# Patient Record
Sex: Male | Born: 1975 | Race: White | Hispanic: No | Marital: Married | Smoking: Never smoker
Health system: Southern US, Community
[De-identification: ages and names within clinical notes are randomized; demographics above are authoritative.]

## PROBLEM LIST (undated history)

## (undated) DIAGNOSIS — N200 Calculus of kidney: Secondary | ICD-10-CM

## (undated) DIAGNOSIS — R0602 Shortness of breath: Secondary | ICD-10-CM

## (undated) HISTORY — PX: PALATE / UVULA BIOPSY / EXCISION: SUR128

## (undated) HISTORY — PX: APPENDECTOMY: SHX54

---

## 2019-06-04 ENCOUNTER — Other Ambulatory Visit: Payer: Self-pay

## 2019-06-04 DIAGNOSIS — Z20822 Contact with and (suspected) exposure to covid-19: Secondary | ICD-10-CM

## 2019-06-06 LAB — NOVEL CORONAVIRUS, NAA: SARS-CoV-2, NAA: NOT DETECTED

## 2021-04-09 ENCOUNTER — Other Ambulatory Visit: Payer: Self-pay

## 2021-04-09 ENCOUNTER — Emergency Department (HOSPITAL_BASED_OUTPATIENT_CLINIC_OR_DEPARTMENT_OTHER): Payer: No Typology Code available for payment source

## 2021-04-09 ENCOUNTER — Encounter (HOSPITAL_BASED_OUTPATIENT_CLINIC_OR_DEPARTMENT_OTHER): Payer: Self-pay | Admitting: *Deleted

## 2021-04-09 ENCOUNTER — Emergency Department (HOSPITAL_BASED_OUTPATIENT_CLINIC_OR_DEPARTMENT_OTHER)
Admission: EM | Admit: 2021-04-09 | Discharge: 2021-04-09 | Disposition: A | Discharge status: Home / Self Care | Payer: No Typology Code available for payment source | Attending: Emergency Medicine | Admitting: Emergency Medicine

## 2021-04-09 DIAGNOSIS — N201 Calculus of ureter: Secondary | ICD-10-CM | POA: Insufficient documentation

## 2021-04-09 DIAGNOSIS — R1031 Right lower quadrant pain: Secondary | ICD-10-CM | POA: Diagnosis present

## 2021-04-09 HISTORY — DX: Calculus of kidney: N20.0

## 2021-04-09 LAB — URINALYSIS, ROUTINE W REFLEX MICROSCOPIC
Bilirubin Urine: NEGATIVE
Glucose, UA: NEGATIVE mg/dL
Ketones, ur: NEGATIVE mg/dL
Leukocytes,Ua: NEGATIVE
Nitrite: NEGATIVE
Protein, ur: NEGATIVE mg/dL
Specific Gravity, Urine: 1.02 (ref 1.005–1.030)
pH: 5.5 (ref 5.0–8.0)

## 2021-04-09 LAB — COMPREHENSIVE METABOLIC PANEL
ALT: 39 U/L (ref 0–44)
AST: 27 U/L (ref 15–41)
Albumin: 4.7 g/dL (ref 3.5–5.0)
Alkaline Phosphatase: 80 U/L (ref 38–126)
Anion gap: 9 (ref 5–15)
BUN: 13 mg/dL (ref 6–20)
CO2: 25 mmol/L (ref 22–32)
Calcium: 9.3 mg/dL (ref 8.9–10.3)
Chloride: 102 mmol/L (ref 98–111)
Creatinine, Ser: 1.37 mg/dL — ABNORMAL HIGH (ref 0.61–1.24)
GFR, Estimated: >60 mL/min (ref >60)
Glucose, Bld: 126 mg/dL — ABNORMAL HIGH (ref 70–99)
Potassium: 3.8 mmol/L (ref 3.5–5.1)
Sodium: 136 mmol/L (ref 135–145)
Total Bilirubin: 1 mg/dL (ref 0.3–1.2)
Total Protein: 7.6 g/dL (ref 6.5–8.1)

## 2021-04-09 LAB — CBC WITH DIFFERENTIAL/PLATELET
Abs Immature Granulocytes: 0.04 10*3/uL (ref 0.00–0.07)
Basophils Absolute: 0.1 10*3/uL (ref 0.0–0.1)
Basophils Relative: 1 %
Eosinophils Absolute: 0.2 10*3/uL (ref 0.0–0.5)
Eosinophils Relative: 2 %
HCT: 46.4 % (ref 39.0–52.0)
Hemoglobin: 16 g/dL (ref 13.0–17.0)
Immature Granulocytes: 0 %
Lymphocytes Relative: 26 %
Lymphs Abs: 2.7 10*3/uL (ref 0.7–4.0)
MCH: 29.4 pg (ref 26.0–34.0)
MCHC: 34.5 g/dL (ref 30.0–36.0)
MCV: 85.1 fL (ref 80.0–100.0)
Monocytes Absolute: 0.9 10*3/uL (ref 0.1–1.0)
Monocytes Relative: 9 %
Neutro Abs: 6.2 10*3/uL (ref 1.7–7.7)
Neutrophils Relative %: 62 %
Platelets: 290 10*3/uL (ref 150–400)
RBC: 5.45 MIL/uL (ref 4.22–5.81)
RDW: 13.4 % (ref 11.5–15.5)
WBC: 10.1 10*3/uL (ref 4.0–10.5)
nRBC: 0 % (ref 0.0–0.2)

## 2021-04-09 LAB — URINALYSIS, MICROSCOPIC (REFLEX)

## 2021-04-09 MED ORDER — FENTANYL CITRATE PF 50 MCG/ML IJ SOSY
50.0000 ug | PREFILLED_SYRINGE | Freq: Once | INTRAMUSCULAR | Status: AC
Start: 2021-04-09 — End: 2021-04-09
  Administered 2021-04-09: 50 ug via INTRAVENOUS
  Filled 2021-04-09: qty 1

## 2021-04-09 MED ORDER — ONDANSETRON HCL 4 MG/2ML IJ SOLN
4.0000 mg | Freq: Once | INTRAMUSCULAR | Status: AC
  Administered 2021-04-09: 4 mg via INTRAVENOUS
  Filled 2021-04-09: qty 2

## 2021-04-09 MED ORDER — ONDANSETRON 4 MG PO TBDP: 4.0000 mg | ORAL_TABLET | Freq: Three times a day (TID) | ORAL | 0 refills | Status: DC | PRN

## 2021-04-09 MED ORDER — FENTANYL CITRATE PF 50 MCG/ML IJ SOSY
50.0000 ug | PREFILLED_SYRINGE | INTRAMUSCULAR | Status: DC | PRN
  Administered 2021-04-09: 50 ug via INTRAVENOUS
  Filled 2021-04-09: qty 1

## 2021-04-09 MED ORDER — KETOROLAC TROMETHAMINE 10 MG PO TABS: 10.0000 mg | ORAL_TABLET | Freq: Four times a day (QID) | ORAL | 0 refills | Status: DC | PRN

## 2021-04-09 MED ORDER — OXYCODONE-ACETAMINOPHEN 5-325 MG PO TABS: 1.0000 | ORAL_TABLET | Freq: Four times a day (QID) | ORAL | 0 refills | Status: DC | PRN

## 2021-04-09 NOTE — ED Provider Notes (Signed)
[facility] EMERGENCY DEPARTMENT Provider Note   CSN: [id number] Arrival date & time: 04/09/21  1547     History Chief Complaint  Patient presents with   Flank Pain    Ricardo Ferguson is a 45 y.o. male who presents to the emergency department for further evaluation of waxing and waning sharp right-sided flank pain that began this morning.  He does endorse a history of kidney stones and states this feels similar however is much more severe.  He states his right-sided flank pain radiates into the right lower quadrant abdomen and into the groin.  He reports associated nausea and diaphoresis but denies any vomiting, fever, chills, diarrhea, urinary complaints, chest pain, shortness of breath. He rates his flank pain moderate in severity.   The history is provided by the patient and the spouse. No language interpreter was used.  Flank Pain      Past Medical History:  Diagnosis Date   Kidney stone     There are no problems to display for this patient.   Past Surgical History:  Procedure Laterality Date   APPENDECTOMY         History reviewed. No pertinent family history.  Social History   Tobacco Use   Smoking status: Never   Smokeless tobacco: Never  Substance Use Topics   Alcohol use: Never   Drug use: Never    Home Medications Prior to Admission medications   Medication Sig Start Date End Date Taking? Authorizing Provider  ketorolac (TORADOL) 10 MG tablet Take 1 tablet (10 mg total) by mouth every 6 (six) hours as needed. 04/09/21  Yes Meredeth Ide, Lorina Duffner M, PA-C  ondansetron (ZOFRAN ODT) 4 MG disintegrating tablet Take 1 tablet (4 mg total) by mouth every 8 (eight) hours as needed for nausea or vomiting. 04/09/21  Yes Meredeth Ide, Jermari Tamargo M, PA-C  oxyCODONE-acetaminophen (PERCOCET/ROXICET) 5-325 MG tablet Take 1 tablet by mouth every 6 (six) hours as needed for severe pain. 04/09/21  Yes Honor Loh M, PA-C    Allergies    Patient has no known  allergies.  Review of Systems   Review of Systems  Genitourinary:  Positive for flank pain. Negative for dysuria, frequency, hematuria and urgency.  All other systems reviewed and are negative.  Physical Exam Updated Vital Signs BP (!) 136/102   Pulse 76   Temp 97.8 F (36.6 C) (Oral)   Resp 16   Ht 5\' 8"  (1.727 m)   Wt 99.8 kg   SpO2 98%   BMI 33.45 kg/m   Physical Exam Constitutional:      General: He is not in acute distress.    Appearance: Normal appearance.  HENT:     Head: Normocephalic and atraumatic.  Eyes:     General:        Right eye: No discharge.        Left eye: No discharge.  Cardiovascular:     Comments: Regular rate and rhythm.  S1/S2 are distinct without any evidence of murmur, rubs, or gallops.  Radial pulses are 2+ bilaterally.  Dorsalis pedis pulses are 2+ bilaterally.  No evidence of pedal edema. Pulmonary:     Comments: Clear to auscultation bilaterally.  Normal effort.  No respiratory distress.  No evidence of wheezes, rales, or rhonchi heard throughout. Abdominal:     General: Abdomen is flat. Bowel sounds are normal. There is no distension.     Tenderness: There is no abdominal tenderness. There is no guarding or rebound.  Comments: No CVA tenderness bilaterally.  Musculoskeletal:        General: Normal range of motion.     Cervical back: Neck supple.  Skin:    General: Skin is warm and dry.     Findings: No rash.  Neurological:     General: No focal deficit present.     Mental Status: He is alert.  Psychiatric:        Mood and Affect: Mood normal.        Behavior: Behavior normal.    ED Results / Procedures / Treatments   Labs (all labs ordered are listed, but only abnormal results are displayed) Labs Reviewed  URINALYSIS, ROUTINE W REFLEX MICROSCOPIC - Abnormal; Notable for the following components:      Result Value   Hgb urine dipstick SMALL (*)    All other components within normal limits  COMPREHENSIVE METABOLIC PANEL -  Abnormal; Notable for the following components:   Glucose, Bld 126 (*)    Creatinine, Ser 1.37 (*)    All other components within normal limits  URINALYSIS, MICROSCOPIC (REFLEX) - Abnormal; Notable for the following components:   Bacteria, UA RARE (*)    All other components within normal limits  CBC WITH DIFFERENTIAL/PLATELET    EKG None  Radiology CT Renal Stone Study  Result Date: 04/09/2021 CLINICAL DATA:  Kidney stone suspected.  Flank pain.  Right side. EXAM: CT ABDOMEN AND PELVIS WITHOUT CONTRAST TECHNIQUE: Multidetector CT imaging of the abdomen and pelvis was performed following the standard protocol without IV contrast. COMPARISON:  None. FINDINGS: Lower chest: No acute abnormality.  Question trace hiatal hernia. Hepatobiliary: No focal liver abnormality. No gallstones, gallbladder wall thickening, or pericholecystic fluid. No biliary dilatation. Pancreas: No focal lesion. Normal pancreatic contour. No surrounding inflammatory changes. No main pancreatic ductal dilatation. Spleen: Normal in size without focal abnormality. Adrenals/Urinary Tract: No adrenal nodule bilaterally. Punctate bilateral nephrolithiasis. There is a 2 mm calcified stone within the proximal to mid right ureter with associated mild proximal hydroureteronephrosis. No left hydronephrosis. No definite contour-deforming renal mass. No left ureterolithiasis or hydroureter. The urinary bladder is unremarkable. Stomach/Bowel: Stomach is within normal limits. No evidence of bowel wall thickening or dilatation. Status post appendectomy. Vascular/Lymphatic: No abdominal aorta or iliac aneurysm. Mild atherosclerotic plaque of the aorta and its branches. No abdominal, pelvic, or inguinal lymphadenopathy. Reproductive: Prominent prostate. Other: No intraperitoneal free fluid. No intraperitoneal free gas. No organized fluid collection. Musculoskeletal: No abdominal wall hernia or abnormality. No suspicious lytic or blastic osseous  lesions. No acute displaced fracture. Bilateral L5 pars interarticularis defects. IMPRESSION: 1. Obstructive 2 mm proximal to mid right ureter nephrolithiasis. 2. Nonobstructive bilateral punctate nephrolithiasis. 3. Question trace hiatal hernia. 4. Prominent prostate. Electronically Signed   By: Tish Frederickson M.D.   On: 04/09/2021 17:34    Procedures Procedures   Medications Ordered in ED Medications  fentaNYL (SUBLIMAZE) injection 50 mcg (50 mcg Intravenous Given 04/09/21 1614)  ondansetron (ZOFRAN) injection 4 mg (4 mg Intravenous Given 04/09/21 1614)  fentaNYL (SUBLIMAZE) injection 50 mcg (50 mcg Intravenous Given 04/09/21 1908)    ED Course  I have reviewed the triage vital signs and the nursing notes.  Pertinent labs & imaging results that were available during my care of the patient were reviewed by me and considered in my medical decision making (see chart for details).    MDM Rules/Calculators/A&P  Ricardo Vallee is a 45 y.o. male who presents to the emergency department for right-sided flank pain.  His flank pain is likely secondary to the 2 mm kidney stone that was found on imaging in the mid right ureter.  History and physical exam is not consistent with appendicitis, torsion, hepatobiliary pathology, pyelonephritis at this time.  His CBC and CMP are normal.  UA showed hematuria.  His pain was adequately controlled with fentanyl given in the department.  I told him that he this should pass on its own.  I told him to drink plenty of fluids.  Strict return precautions were given.  Family amenable to this plan. I have given him some percocet and Zofran for at home pain control. He is hemodynamically stable and safe for discharge.  Ketorolac was not prescribed. This was an error and the script was discarded in the shredder.    Final Clinical Impression(s) / ED Diagnoses Final diagnoses:  Ureteral calculi    Rx / DC Orders ED Discharge Orders           Ordered    ketorolac (TORADOL) 10 MG tablet  Every 6 hours PRN        04/09/21 1955    oxyCODONE-acetaminophen (PERCOCET/ROXICET) 5-325 MG tablet  Every 6 hours PRN        04/09/21 1958    ondansetron (ZOFRAN ODT) 4 MG disintegrating tablet  Every 8 hours PRN        04/09/21 1958             Teressa Lower, PA-C 04/09/21 2002    Charlynne Pander, MD 04/09/21 [phone]

## 2021-04-09 NOTE — ED Triage Notes (Signed)
Pt reports right flank pain since this am, severe over last hour, reports hx of kidney stones. Endorses nausea, denies vomiting

## 2021-04-09 NOTE — Discharge Instructions (Addendum)
You were seen and evaluated in the emergency department today for further evaluation of right flank pain.  As we discussed, there is a small kidney stone in the ureter which is causing your pain.  This will likely pass on its own.  Please take Tylenol/ibuprofen on a scheduled basis until you pass the kidney stone her pain resolves. I will also provide you with Toradol for pain.   Please turn to the emerge apartment if you experience worsening pain, unable to pass the stone, fevers, intractable vomiting, or any other concerns might have.

## 2021-09-27 ENCOUNTER — Encounter (HOSPITAL_BASED_OUTPATIENT_CLINIC_OR_DEPARTMENT_OTHER): Payer: Self-pay | Admitting: Emergency Medicine

## 2021-09-27 ENCOUNTER — Other Ambulatory Visit: Payer: Self-pay

## 2021-09-27 ENCOUNTER — Emergency Department (HOSPITAL_BASED_OUTPATIENT_CLINIC_OR_DEPARTMENT_OTHER): Payer: No Typology Code available for payment source

## 2021-09-27 ENCOUNTER — Emergency Department (HOSPITAL_BASED_OUTPATIENT_CLINIC_OR_DEPARTMENT_OTHER)
Admission: EM | Admit: 2021-09-27 | Discharge: 2021-09-27 | Disposition: A | Discharge status: Home / Self Care | Payer: No Typology Code available for payment source | Attending: Emergency Medicine | Admitting: Emergency Medicine

## 2021-09-27 DIAGNOSIS — R0602 Shortness of breath: Secondary | ICD-10-CM | POA: Diagnosis not present

## 2021-09-27 DIAGNOSIS — R0789 Other chest pain: Secondary | ICD-10-CM

## 2021-09-27 HISTORY — DX: Shortness of breath: R06.02

## 2021-09-27 LAB — BASIC METABOLIC PANEL
Anion gap: 6 (ref 5–15)
BUN: 9 mg/dL (ref 6–20)
CO2: 26 mmol/L (ref 22–32)
Calcium: 9 mg/dL (ref 8.9–10.3)
Chloride: 104 mmol/L (ref 98–111)
Creatinine, Ser: 1.08 mg/dL (ref 0.61–1.24)
GFR, Estimated: >60 mL/min (ref >60)
Glucose, Bld: 125 mg/dL — ABNORMAL HIGH (ref 70–99)
Potassium: 3.7 mmol/L (ref 3.5–5.1)
Sodium: 136 mmol/L (ref 135–145)

## 2021-09-27 LAB — CBC WITH DIFFERENTIAL/PLATELET
Abs Immature Granulocytes: 0.03 10*3/uL (ref 0.00–0.07)
Basophils Absolute: 0.1 10*3/uL (ref 0.0–0.1)
Basophils Relative: 1 %
Eosinophils Absolute: 0.3 10*3/uL (ref 0.0–0.5)
Eosinophils Relative: 4 %
HCT: 43.8 % (ref 39.0–52.0)
Hemoglobin: 15.4 g/dL (ref 13.0–17.0)
Immature Granulocytes: 0 %
Lymphocytes Relative: 34 %
Lymphs Abs: 2.7 10*3/uL (ref 0.7–4.0)
MCH: 29.7 pg (ref 26.0–34.0)
MCHC: 35.2 g/dL (ref 30.0–36.0)
MCV: 84.6 fL (ref 80.0–100.0)
Monocytes Absolute: 0.7 10*3/uL (ref 0.1–1.0)
Monocytes Relative: 9 %
Neutro Abs: 4.2 10*3/uL (ref 1.7–7.7)
Neutrophils Relative %: 52 %
Platelets: 233 10*3/uL (ref 150–400)
RBC: 5.18 MIL/uL (ref 4.22–5.81)
RDW: 13.3 % (ref 11.5–15.5)
WBC: 7.9 10*3/uL (ref 4.0–10.5)
nRBC: 0 % (ref 0.0–0.2)

## 2021-09-27 LAB — TROPONIN I (HIGH SENSITIVITY)
Troponin I (High Sensitivity): 3 ng/L (ref <18)
Troponin I (High Sensitivity): 3 ng/L (ref <18)

## 2021-09-27 LAB — D-DIMER, QUANTITATIVE: D-Dimer, Quant: <0.27 ug/mL-FEU (ref 0.00–0.50)

## 2021-09-27 MED ORDER — FUROSEMIDE 10 MG/ML IJ SOLN
40.0000 mg | Freq: Once | INTRAMUSCULAR | Status: AC
  Administered 2021-09-27: 40 mg via INTRAVENOUS
  Filled 2021-09-27: qty 4

## 2021-09-27 MED ORDER — ASPIRIN 81 MG PO CHEW
324.0000 mg | CHEWABLE_TABLET | Freq: Once | ORAL | Status: AC
  Administered 2021-09-27: 324 mg via ORAL
  Filled 2021-09-27: qty 4

## 2021-09-27 MED ORDER — NITROGLYCERIN 0.4 MG SL SUBL
0.4000 mg | SUBLINGUAL_TABLET | SUBLINGUAL | Status: DC | PRN
  Administered 2021-09-27: 0.4 mg via SUBLINGUAL
  Filled 2021-09-27: qty 1

## 2021-09-27 MED ORDER — AMLODIPINE BESYLATE 2.5 MG PO TABS: 2.5000 mg | ORAL_TABLET | Freq: Every day | ORAL | 0 refills | Status: DC

## 2021-09-27 MED ORDER — NITROGLYCERIN 0.4 MG SL SUBL: 0.4000 mg | SUBLINGUAL_TABLET | SUBLINGUAL | 0 refills | Status: AC | PRN

## 2021-09-27 MED ORDER — IOHEXOL 350 MG/ML SOLN
100.0000 mL | Freq: Once | INTRAVENOUS | Status: AC | PRN
  Administered 2021-09-27: 100 mL via INTRAVENOUS

## 2021-09-27 NOTE — ED Provider Notes (Signed)
?[facility] EMERGENCY DEPARTMENT ?Provider Note ? ? ?CSN: [id number] ?Arrival date & time: 09/27/21  0503 ? ?  ? ?History ? ?Chief Complaint  ?Patient presents with  ? Shortness of Breath  ? ? ?Ricardo Ferguson is a 46 y.o. male. ? ?The history is provided by the patient.  ?Shortness of Breath ?He was awakened at about 2:30 AM with difficulty breathing.  He noticed that he felt better if he stood up.  There was an associated heavy, tight feeling in his chest.  There was no nausea, but he has broken out in a sweat.  Nothing makes his symptoms better, nothing made them worse.  The heavy and tight feeling has diminished, but is still present.  There is no radiation of that discomfort.  He has never had symptoms like this before.  However, he did notice that he was more fatigued than normal after playing with his son 2 days ago.  He is a non-smoker and denies history of hypertension or diabetes.  He was told by his primary care provider that his cholesterol was borderline and they wanted to check it 1 more time before deciding whether to put him on medication.  He denies any family history of premature coronary atherosclerosis. ?  ?Home Medications ?Prior to Admission medications   ?Medication Sig Start Date End Date Taking? Authorizing Provider  ?ketorolac (TORADOL) 10 MG tablet Take 1 tablet (10 mg total) by mouth every 6 (six) hours as needed. 04/09/21   Honor Loh M, PA-C  ?ondansetron (ZOFRAN ODT) 4 MG disintegrating tablet Take 1 tablet (4 mg total) by mouth every 8 (eight) hours as needed for nausea or vomiting. 04/09/21   Teressa Lower, PA-C  ?oxyCODONE-acetaminophen (PERCOCET/ROXICET) 5-325 MG tablet Take 1 tablet by mouth every 6 (six) hours as needed for severe pain. 04/09/21   Teressa Lower, PA-C  ?   ? ?Allergies    ?Patient has no known allergies.   ? ?Review of Systems   ?Review of Systems  ?Respiratory:  Positive for shortness of breath.   ?All other systems reviewed and are  negative. ? ?Physical Exam ?Updated Vital Signs ?BP (!) 168/106 (BP Location: Right Arm)   Pulse 79   Temp 97.8 ?F (36.6 ?C) (Oral)   Resp 14   Ht 5\' 7"  (1.702 m)   Wt 107 kg   SpO2 99%   BMI 36.96 kg/m?  ?Physical Exam ?Vitals and nursing note reviewed.  ?46 year old male, resting comfortably and in no acute distress. Vital signs are significant for elevated blood pressure. Oxygen saturation is 99%, which is normal. ?Head is normocephalic and atraumatic. PERRLA, EOMI. Oropharynx is clear. ?Neck is nontender and supple without adenopathy or JVD. ?Back is nontender and there is no CVA tenderness. ?Lungs are clear without rales, wheezes, or rhonchi. ?Chest is nontender. ?Heart has regular rate and rhythm without murmur. ?Abdomen is soft, flat, nontender. ?Extremities have trace edema, full range of motion is present. ?Skin is warm and dry without rash. ?Neurologic: Mental status is normal, cranial nerves are intact, moves all extremities equally. ? ?ED Results / Procedures / Treatments   ?Labs ?(all labs ordered are listed, but only abnormal results are displayed) ?Labs Reviewed  ?BASIC METABOLIC PANEL - Abnormal; Notable for the following components:  ?    Result Value  ? Glucose, Bld 125 (*)   ? All other components within normal limits  ?CBC WITH DIFFERENTIAL/PLATELET  ?D-DIMER, QUANTITATIVE  ?TROPONIN I (HIGH SENSITIVITY)  ?TROPONIN  I (HIGH SENSITIVITY)  ? ? ?EKG ?EKG Interpretation ? ?Date/Time:  Tuesday September 27 2021 05:14:41 EDT ?Ventricular Rate:  79 ?PR Interval:  155 ?QRS Duration: 97 ?QT Interval:  387 ?QTC Calculation: 444 ?R Axis:   6 ?Text Interpretation: Sinus rhythm Normal ECG No old tracing to compare Confirmed by Dione Booze ([id number]) on 09/27/2021 5:20:36 AM ? ?Radiology ?DG Chest Port 1 View ? ?Result Date: 09/27/2021 ?CLINICAL DATA:  Shortness of breath. EXAM: PORTABLE CHEST 1 VIEW COMPARISON:  None. FINDINGS: 0544 hours. Mild asymmetric elevation right hemidiaphragm. The lungs are clear  without focal pneumonia, edema, pneumothorax or pleural effusion. Cardiopericardial silhouette is at upper limits of normal for size. The visualized bony structures of the thorax are unremarkable. Telemetry leads overlie the chest. IMPRESSION: No active disease. Electronically Signed   By: Kennith Center M.D.   On: 09/27/2021 06:21   ? ?Procedures ?Procedures  ?Cardiac monitor shows normal sinus rhythm, per my interpretation. ? ?Medications Ordered in ED ?Medications  ?aspirin chewable tablet 324 mg (has no administration in time range)  ?nitroGLYCERIN (NITROSTAT) SL tablet 0.4 mg (has no administration in time range)  ? ? ?ED Course/ Medical Decision Making/ A&P ?  ?                        ?Medical Decision Making ?Amount and/or Complexity of Data Reviewed ?Labs: ordered. ?Radiology: ordered. ? ?Risk ?OTC drugs. ?Prescription drug management. ? ? ?Dyspnea and chest discomfort concerning for ACS.  Consider pulmonary embolism, pneumonia.  No evidence of bronchospasm to suggest bronchitis or asthma.  Symptoms very atypical for aortic dissection.  Heart score is 2 without including troponin results.  This puts him at low risk for major adverse cardiac events in the next 6 weeks.  Old records are reviewed, and cholesterol and triglycerides have actually been fairly consistently elevated going back through 2018 (results seen on Care Everywhere).  He is given a dose of aspirin and nitroglycerin.  ECG is obtained to look for evidence of ischemic changes, but is normal.  Chest x-ray and troponin have been ordered as well as CBC and metabolic panel. ? ?Chest x-ray shows no acute process.  I have independently viewed the image, and agree with the radiologist's interpretation.  CBC and metabolic panel are unremarkable except for minimally elevated glucose of 125 which will need to be followed as an outpatient.  Initial troponin is 3, repeat troponin is pending.  D-dimer is normal, no evidence of pulmonary embolism.  Patient did  state that he felt better following single nitroglycerin and has not had any recurrence of his chest discomfort.  Case is signed out to Dr. Fredderick Phenix pending repeat troponin. ? ?Final Clinical Impression(s) / ED Diagnoses ?Final diagnoses:  ?SOB (shortness of breath)  ?Chest discomfort  ? ? ?Rx / DC Orders ?ED Discharge Orders   ? ? None  ? ?  ? ? ?  ?Dione Booze, MD ?09/27/21 [phone] ? ?

## 2021-09-27 NOTE — ED Notes (Addendum)
Patient stated that he was not having pain,but that he had pressures in his chest he was given 1 Nitroglycerin 0.4 mg an states that it relieved the pressure.Notified Doctor. Doctor states that the one nitroglycerin was enough. ?

## 2021-09-27 NOTE — Progress Notes (Signed)
Dr. Tenny Craw call and wanted echo ordered for patient. Placed order for patient to have as soon as possible. ?

## 2021-09-27 NOTE — ED Provider Notes (Signed)
Care was taken over from Dr. Roxanne Mins.  Patient presented with an episode of shortness of breath, diaphoresis and mild chest tightness that woke him up during the night.  He does report over the last 2 days he has had 2 episodes of shortness of breath while he is playing with his kids or walking upstairs.  He does have a low heart score.  He has diet-controlled hyperlipidemia and his blood pressure is been elevated in the ED.  No other significant risk factors.  He has had 2 negative troponins.  His EKG does not show any ischemic changes.  I ambulated the patient around the nurses station and he did get a bit short of breath with ambulation.  He did not get hypoxic or tachycardic but did feel short of breath.  Given this, CT scan was performed of his chest which showed no evidence of PE.  I spoke with Dr. Harrington Challenger with cardiology.  She recommended trying a dose of Lasix.  He was given a dose of IV Lasix and diuresed about 1.5 L.  He be ambulated around the ED and felt much better.  He had no symptoms.  No shortness of breath.  I consulted with Dr. Harrington Challenger again and she has arranged close follow-up with Dr. Marlou Porch with cardiology tomorrow at 1 PM.  Patient was advised of this.  He is amenable to this.  He was given a prescription for amlodipine and nitroglycerin per Dr. Alan Ripper request.  I did advise him that if he has any worsening symptoms or symptoms do not resolve after 1 nitroglycerin, he needs to return to the emergency room.  Otherwise follow-up with Dr. Marlou Porch tomorrow. ?  Malvin Johns, MD ?09/27/21 1156 ? ?

## 2021-09-27 NOTE — ED Notes (Addendum)
Pt ambulated after urinating. Pt walked two laps around the nurse station. First lap, pt maintained oxygen saturation between 99-100% on room air. Second lap, patient maintained oxygen saturation between 97%-100% on room air. Denies dyspnea during ambulation and rest. Provider made aware.  ?

## 2021-09-27 NOTE — ED Triage Notes (Signed)
Pt states he woke up around 230 this morning and felt like he could not get a good breath and had pressure in his chest  Pt states he laid back down and tried to relax but the feeling would not go away  Pt states on the way here he started having numbness in his left hand and arm   ?

## 2021-09-28 ENCOUNTER — Encounter: Payer: Self-pay | Admitting: Cardiology

## 2021-09-28 ENCOUNTER — Ambulatory Visit (HOSPITAL_COMMUNITY)
Admission: RE | Admit: 2021-09-28 | Discharge: 2021-09-28 | Disposition: A | Discharge status: Home / Self Care | Payer: No Typology Code available for payment source | Source: Ambulatory Visit | Attending: Internal Medicine | Admitting: Internal Medicine

## 2021-09-28 ENCOUNTER — Ambulatory Visit (INDEPENDENT_AMBULATORY_CARE_PROVIDER_SITE_OTHER): Payer: No Typology Code available for payment source | Admitting: Cardiology

## 2021-09-28 VITALS — BP 122/82 | HR 86 | Ht 67.0 in | Wt 235.0 lb

## 2021-09-28 DIAGNOSIS — G4733 Obstructive sleep apnea (adult) (pediatric): Secondary | ICD-10-CM | POA: Diagnosis not present

## 2021-09-28 DIAGNOSIS — R079 Chest pain, unspecified: Secondary | ICD-10-CM | POA: Diagnosis not present

## 2021-09-28 DIAGNOSIS — R0609 Other forms of dyspnea: Secondary | ICD-10-CM | POA: Diagnosis present

## 2021-09-28 DIAGNOSIS — R0602 Shortness of breath: Secondary | ICD-10-CM | POA: Diagnosis not present

## 2021-09-28 DIAGNOSIS — I517 Cardiomegaly: Secondary | ICD-10-CM | POA: Insufficient documentation

## 2021-09-28 DIAGNOSIS — R072 Precordial pain: Secondary | ICD-10-CM | POA: Insufficient documentation

## 2021-09-28 DIAGNOSIS — I10 Essential (primary) hypertension: Secondary | ICD-10-CM | POA: Diagnosis not present

## 2021-09-28 DIAGNOSIS — R0789 Other chest pain: Secondary | ICD-10-CM

## 2021-09-28 LAB — ECHOCARDIOGRAM COMPLETE
Area-P 1/2: 3.63 cm2
S' Lateral: 2.8 cm

## 2021-09-28 MED ORDER — METOPROLOL TARTRATE 100 MG PO TABS: ORAL_TABLET | ORAL | 0 refills | Status: AC

## 2021-09-28 NOTE — Patient Instructions (Addendum)
Medication Instructions:  ?Your physician recommends that you continue on your current medications as directed. Please refer to the Current Medication list given to you today. ? ?*If you need a refill on your cardiac medications before your next appointment, please call your pharmacy* ? ?Lab Work: ?Your physician recommends that you have lab work today- BMET ? ?If you have labs (blood work) drawn today and your tests are completely normal, you will receive your results only by: ?MyChart Message (if you have MyChart) OR ?A paper copy in the mail ?If you have any lab test that is abnormal or we need to change your treatment, we will call you to review the results. ? ? ?Testing/Procedures: ?Your physician has requested that you have cardiac CT. Cardiac computed tomography (CT) is a painless test that uses an x-ray machine to take clear, detailed pictures of your heart. For further information please visit [email or web address] Please follow instruction sheet as given. ? ?Follow-Up: ?At [facility], you and your health needs are our priority.  As part of our continuing mission to provide you with exceptional heart care, we have created designated Provider Care Teams.  These Care Teams include your primary Cardiologist (physician) and Advanced Practice Providers (APPs -  Physician Assistants and Nurse Practitioners) who all work together to provide you with the care you need, when you need it. ? ?We recommend signing up for the patient portal called "MyChart".  Sign up information is provided on this After Visit Summary.  MyChart is used to connect with patients for Virtual Visits (Telemedicine).  Patients are able to view lab/test results, encounter notes, upcoming appointments, etc.  Non-urgent messages can be sent to your provider as well.   ?To learn more about what you can do with MyChart, go to [email or web address].   ? ?Your next appointment:   ?As needed ? ?The format for your next appointment:   ?In  Person ? ?Provider:   ?Donato Schultz, MD { ? ?Other Instructions ? ? ?Your cardiac CT will be scheduled at one of the below locations:  ? ?[facility] ?[address] ?[place], [place] [address] ?[phone] ? ? ?If scheduled at [facility], please arrive at the [facility] and [facility] Entrance (Entrance C2) of [facility] 30 minutes prior to test start time. ?You can use the FREE valet parking offered at entrance C (encouraged to control the heart rate for the test)  ?Proceed to the [facility] Radiology Department (first floor) to check-in and test prep. ? ?All radiology patients and guests should use entrance C2 at [facility], accessed from [facility], even though the hospital's physical address listed is [address]. ? ? ? ?Please follow these instructions carefully (unless otherwise directed): ? ?Hold all erectile dysfunction medications at least 3 days (72 hrs) prior to test. ? ?On the Night Before the Test: ?Be sure to Drink plenty of water. ?Do not consume any caffeinated/decaffeinated beverages or chocolate 12 hours prior to your test. ?Do not take any antihistamines 12 hours prior to your test. ? ?On the Day of the Test: ?Drink plenty of water until 1 hour prior to the test. ?Do not eat any food 4 hours prior to the test. ?You may take your regular medications prior to the test.  ?Take metoprolol (Lopressor) two hours prior to test. ?     ?After the Test: ?Drink plenty of water. ?After receiving IV contrast, you may experience a mild flushed feeling. This is normal. ?  On occasion, you may experience a mild rash up to 24 hours after the test. This is not dangerous. If this occurs, you can take Benadryl 25 mg and increase your fluid intake. ?If you experience trouble breathing, this can be serious. If it is severe call 911 IMMEDIATELY. If it is mild, please call our office. ? ? ?We will call to schedule your test 2-4 weeks out understanding that some  insurance companies will need an authorization prior to the service being performed.  ? ?For non-scheduling related questions, please contact the cardiac imaging nurse navigator should you have any questions/concerns: ?Rockwell Alexandria, Cardiac Imaging Nurse Navigator ?Larey Brick, Cardiac Imaging Nurse Navigator ?[facility] ?Direct Office Dial: [phone]  ? ?For scheduling needs, including cancellations and rescheduling, please call [place], [phone].  ?

## 2021-09-28 NOTE — Assessment & Plan Note (Signed)
More so a chest pressure with excessive shortness of breath with activity relieved with rest.  No PE on CT.  EKG fairly unremarkable.  Troponin was also normal.  Given these symptoms and recently elevated blood pressure, we will go ahead and proceed with coronary CT scan to ensure that he does not have any evidence of significant coronary stenosis.  Echocardiogram is currently pending upon first glance appears to have normal ejection fraction.   ?

## 2021-09-28 NOTE — Assessment & Plan Note (Signed)
Has had uvuloplasty performed. ?

## 2021-09-28 NOTE — Progress Notes (Signed)
?Cardiology Office Note:   ? ?Date:  09/28/2021  ? ?ID:  Ricardo Franson, DOB June 04, 1976, MRN [id number] ? ?PCP:  Pcp, No  ?Cardiologist:  Donato Schultz, MD   ? ?Referring MD: No ref. provider found  ? ? ? ?History of Present Illness:   ? ?Ricardo Ferguson is a 46 y.o. male here today for hospital follow-up. ? ?He presented to the ED 09/27/21 for shortness of breath, diaphoresis, and chest tightness that woke him up. Over the previous 2 days, he had 2 episodes of SOB with exertion. Troponins were negative and EKG sis not chow ischemic changes. Chest CT showed no evidence of PE. His Heart Score was 2. He was given amlodipine and nitroglycerine and advised to follow-up with cardiology.  ? ?Today, he is accompanied by his wife. A few days ago, he was playing with his children and became winded and had to stop and rest. Tuesday morning, he woke up at 2:30 AM with shortness of breath. He describes the sensation as breathing in but not being able to breath out. He also had chest pressure which he does not describe as pain. At 5:00 AM, he started driving to the ED and noticed tingling in his R arm. His headache starting yesterday continues to persist. He denies any palpitations, lightheadedness, syncope, PND, or lower extremity edema. ? ?He does not smoke. He denies a history of cardiovascular disease on his mother's side. His father's side medical history is unknown. He reports he underwent a sleep test and uvulopalatopharyngoplasty in the past.  ? ?Consider further sleep evaluation once again.  Also he is going to keep on top of his cholesterol.  He is having this repeated soon he states. ? ?ER notes labs CT scan personally reviewed and interpreted ? ?Past Medical History:  ?Diagnosis Date  ? Kidney stone   ? SOB (shortness of breath)   ? ? ?Past Surgical History:  ?Procedure Laterality Date  ? APPENDECTOMY    ? PALATE / UVULA BIOPSY / EXCISION    ? ? ?Current Medications: ?Current Meds  ?Medication Sig  ? amLODipine (NORVASC)  2.5 MG tablet Take 1 tablet (2.5 mg total) by mouth daily.  ? metoprolol tartrate (LOPRESSOR) 100 MG tablet Take one tablet by mouth 2 hours prior to CT scan  ? nitroGLYCERIN (NITROSTAT) 0.4 MG SL tablet Place 1 tablet (0.4 mg total) under the tongue every 5 (five) minutes as needed for chest pain.  ?  ? ?Allergies:   Patient has no known allergies.  ? ?Social History  ? ?Socioeconomic History  ? Marital status: Married  ?  Spouse name: Not on file  ? Number of children: Not on file  ? Years of education: Not on file  ? Highest education level: Not on file  ?Occupational History  ? Not on file  ?Tobacco Use  ? Smoking status: Never  ? Smokeless tobacco: Never  ?Vaping Use  ? Vaping Use: Never used  ?Substance and Sexual Activity  ? Alcohol use: Never  ? Drug use: Never  ? Sexual activity: Not on file  ?Other Topics Concern  ? Not on file  ?Social History Narrative  ? Not on file  ? ?Social Determinants of Health  ? ?Financial Resource Strain: Not on file  ?Food Insecurity: Not on file  ?Transportation Needs: Not on file  ?Physical Activity: Not on file  ?Stress: Not on file  ?Social Connections: Not on file  ?  ? ?Family History: ?The patient's family history is not  on file. ? ?ROS:   ?Please see the history of present illness.    ?(+) Chest pain/pressure ?(+) Shortness of breath ?(+) Tingling (R arm) ?(+) Headache ?All other systems reviewed and negative.  ? ?EKGs/Labs/Other Studies Reviewed:   ? ?The following studies were reviewed today: ?Echo 09/28/21 results not yet available. ? ?CTA Chest 09/27/21 ?IMPRESSION: ?1. No CT evidence for acute pulmonary embolus. ?2. 3 mm left lower lobe pulmonary nodule. No follow-up needed if patient is low-risk.This recommendation follows the consensus statement: Guidelines for Management of Incidental Pulmonary Nodules Detected on CT Images: From the Fleischner Society 2017; Radiology 2017; 284:228-243. ?3. Hepatic steatosis. ? ?Chest X-Ray 09/27/21 ?FINDINGS: ?0544 hours. Mild  asymmetric elevation right hemidiaphragm. The lungs are clear without focal pneumonia, edema, pneumothorax or pleural effusion. Cardiopericardial silhouette is at upper limits of normal for size. The visualized bony structures of the thorax are unremarkable. Telemetry leads overlie the chest. ?  ?IMPRESSION: ?No active disease. ? ?EKG:  EKG was not ordered today ? ?Recent Labs: ?04/09/2021: ALT 39 ?09/27/2021: BUN 9; Creatinine, Ser 1.08; Hemoglobin 15.4; Platelets 233; Potassium 3.7; Sodium 136  ? ?Recent Lipid Panel ?No results found for: CHOL, TRIG, HDL, CHOLHDL, VLDL, LDLCALC, LDLDIRECT ? ?CHA2DS2-VASc Score =   [ ] .  Therefore, the patient's annual risk of stroke is   %.    ?  ? ? ?Physical Exam:   ? ?VS:  BP 122/82   Pulse 86   Ht 5\' 7"  (1.702 m)   Wt 235 lb (106.6 kg)   SpO2 96%   BMI 36.81 kg/m?    ? ?Wt Readings from Last 3 Encounters:  ?09/28/21 235 lb (106.6 kg)  ?09/27/21 236 lb (107 kg)  ?04/09/21 220 lb (99.8 kg)  ?  ? ?GEN: Well nourished, well developed in no acute distress ?HEENT: Normal ?NECK: No JVD; No carotid bruits ?LYMPHATICS: No lymphadenopathy ?CARDIAC: RRR, no murmurs, rubs, gallops ?RESPIRATORY:  Clear to auscultation without rales, wheezing or rhonchi  ?ABDOMEN: Soft, non-tender, non-distended ?MUSCULOSKELETAL:  No edema; No deformity  ?SKIN: Warm and dry ?NEUROLOGIC:  Alert and oriented x 3 ?PSYCHIATRIC:  Normal affect  ? ?ASSESSMENT:   ? ?1. SOB (shortness of breath)   ?2. Precordial chest pain   ?3. Essential hypertension   ?4. Obstructive sleep apnea   ? ?PLAN:   ? ?Precordial chest pain ?More so a chest pressure with excessive shortness of breath with activity relieved with rest.  No PE on CT.  EKG fairly unremarkable.  Troponin was also normal.  Given these symptoms and recently elevated blood pressure, we will go ahead and proceed with coronary CT scan to ensure that he does not have any evidence of significant coronary stenosis.  Echocardiogram is currently pending upon first  glance appears to have normal ejection fraction.   ? ?Essential hypertension ?Currently controlled with amlodipine 2.5 mg.  Perhaps an isolated markedly elevated blood pressure was causing shortness of breath? ? ?Obstructive sleep apnea ?Has had uvuloplasty performed. ? ? ?  ? ?Medication Adjustments/Labs and Tests Ordered: ?Current medicines are reviewed at length with the patient today.  Concerns regarding medicines are outlined above.  ?Orders Placed This Encounter  ?Procedures  ? CT CORONARY MORPH W/CTA COR W/SCORE W/CA W/CM &/OR WO/CM  ? ?Meds ordered this encounter  ?Medications  ? metoprolol tartrate (LOPRESSOR) 100 MG tablet  ?  Sig: Take one tablet by mouth 2 hours prior to CT scan  ?  Dispense:  1 tablet  ?  Refill:  0  ? ?I,Mykaella Javier,acting as a scribe for Coca ColaMark Abuk Selleck, MD.,have documented all relevant documentation on the behalf of Donato SchultzMark Ching Rabideau, MD,as directed by  Donato SchultzMark Laelani Vasko, MD while in the presence of Donato SchultzMark Rivers Gassmann, MD. ? ?I, Donato SchultzMark Robson Trickey, MD, have reviewed all documentation for this visit. The documentation on 09/28/21 for the exam, diagnosis, procedures, and orders are all accurate and complete.  ? ?Signed, ?Donato SchultzMark Mackayla Mullins, MD  ?09/28/2021 2:00 PM    ?[facility] ? ?

## 2021-09-28 NOTE — Assessment & Plan Note (Signed)
Currently controlled with amlodipine 2.5 mg.  Perhaps an isolated markedly elevated blood pressure was causing shortness of breath? ?

## 2021-10-04 ENCOUNTER — Encounter: Payer: Self-pay | Admitting: Cardiology

## 2021-10-05 MED ORDER — AMLODIPINE BESYLATE 2.5 MG PO TABS: 2.5000 mg | ORAL_TABLET | Freq: Every day | ORAL | 3 refills | Status: DC

## 2021-10-07 ENCOUNTER — Telehealth (HOSPITAL_COMMUNITY): Payer: Self-pay | Admitting: Emergency Medicine

## 2021-10-07 NOTE — Telephone Encounter (Signed)
Reaching out to patient to offer assistance regarding upcoming cardiac imaging study; pt verbalizes understanding of appt date/time, parking situation and where to check in, pre-test NPO status and medications ordered, and verified current allergies; name and call back number provided for further questions should they arise ?Rockwell Alexandria RN Navigator Cardiac Imaging ?[facility] ?[phone] office ?[phone] cell ? ?Denies Iv issues ?100mg  metoprolol  ?Arrival 730 ? ?

## 2021-10-11 ENCOUNTER — Ambulatory Visit (HOSPITAL_COMMUNITY)
Admission: RE | Admit: 2021-10-11 | Discharge: 2021-10-11 | Disposition: A | Discharge status: Home / Self Care | Payer: No Typology Code available for payment source | Source: Ambulatory Visit | Attending: Cardiology | Admitting: Cardiology

## 2021-10-11 DIAGNOSIS — I2510 Atherosclerotic heart disease of native coronary artery without angina pectoris: Secondary | ICD-10-CM

## 2021-10-11 DIAGNOSIS — R0602 Shortness of breath: Secondary | ICD-10-CM | POA: Diagnosis present

## 2021-10-11 MED ORDER — NITROGLYCERIN 0.4 MG SL SUBL
0.8000 mg | SUBLINGUAL_TABLET | Freq: Once | SUBLINGUAL | Status: AC
  Administered 2021-10-11: 0.8 mg via SUBLINGUAL

## 2021-10-11 MED ORDER — IOHEXOL 350 MG/ML SOLN
100.0000 mL | Freq: Once | INTRAVENOUS | Status: AC | PRN
  Administered 2021-10-11: 100 mL via INTRAVENOUS

## 2021-10-11 MED ORDER — NITROGLYCERIN 0.4 MG SL SUBL
SUBLINGUAL_TABLET | SUBLINGUAL | Status: AC
  Filled 2021-10-11: qty 2

## 2021-10-12 ENCOUNTER — Other Ambulatory Visit: Payer: Self-pay | Admitting: *Deleted

## 2021-10-12 DIAGNOSIS — R0789 Other chest pain: Secondary | ICD-10-CM

## 2021-10-12 DIAGNOSIS — R931 Abnormal findings on diagnostic imaging of heart and coronary circulation: Secondary | ICD-10-CM

## 2021-10-12 MED ORDER — ROSUVASTATIN CALCIUM 10 MG PO TABS: 10.0000 mg | ORAL_TABLET | Freq: Every day | ORAL | 3 refills | Status: DC

## 2022-10-07 IMAGING — CT CT RENAL STONE PROTOCOL
2 of 4 series · 16 of 46 positions shown, 18 images · non-contrast
Comparison: None.

CLINICAL DATA: Kidney stone suspected.  Flank pain.  Right side.

EXAM:
CT ABDOMEN AND PELVIS WITHOUT CONTRAST
TECHNIQUE: Multidetector CT imaging of the abdomen and pelvis was performed
following the standard protocol without IV contrast.

[Series 2: axial st · axial · 0.85mm/px · z∈[+833,+1283]mm · 13 of 100 slices shown, 15 images]
[im 5/100  soft-tissue]
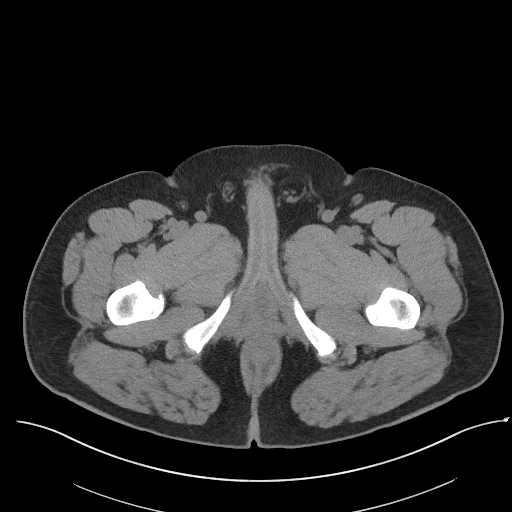
[im 5/100  bone]
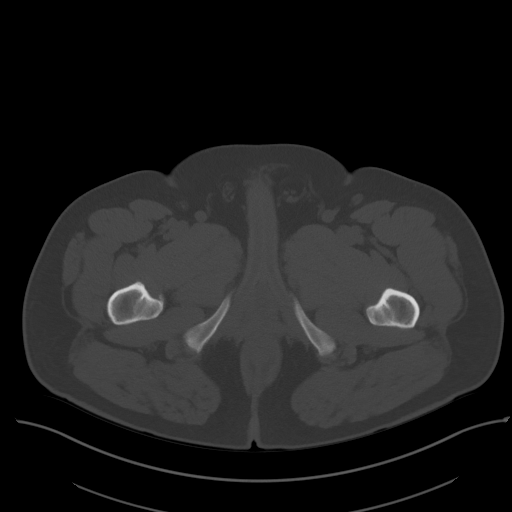
[im 13/100  soft-tissue]
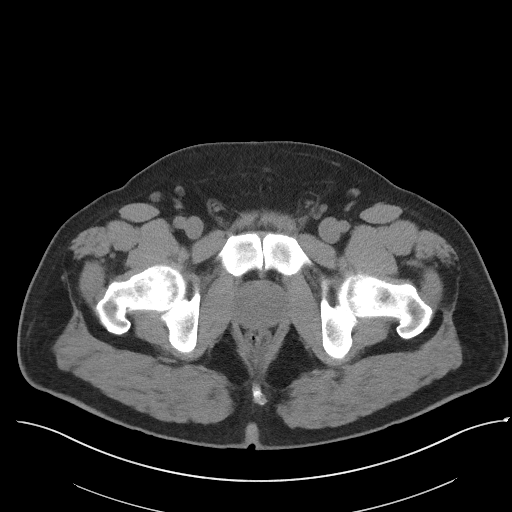
[im 21/100  soft-tissue]
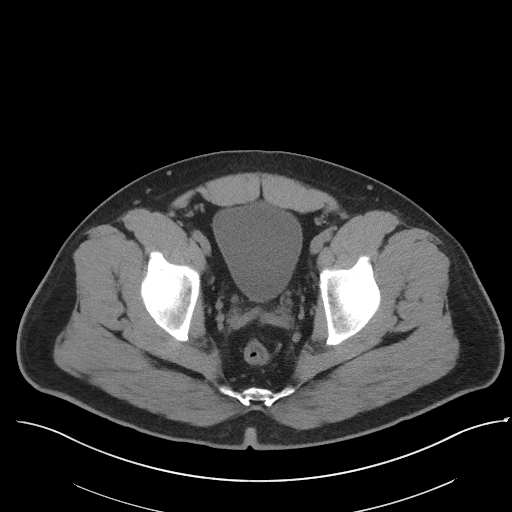
[im 29/100  soft-tissue]
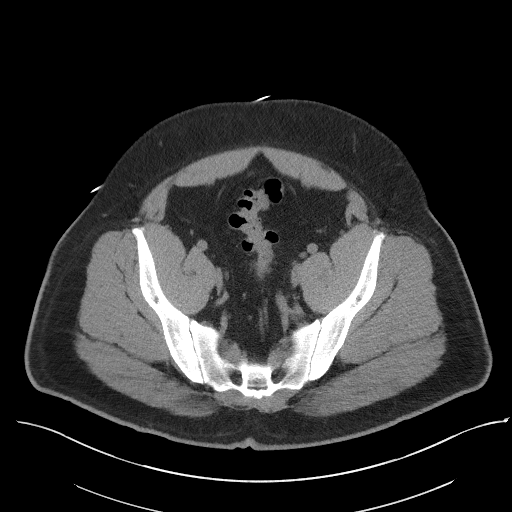
[im 34/100  soft-tissue]
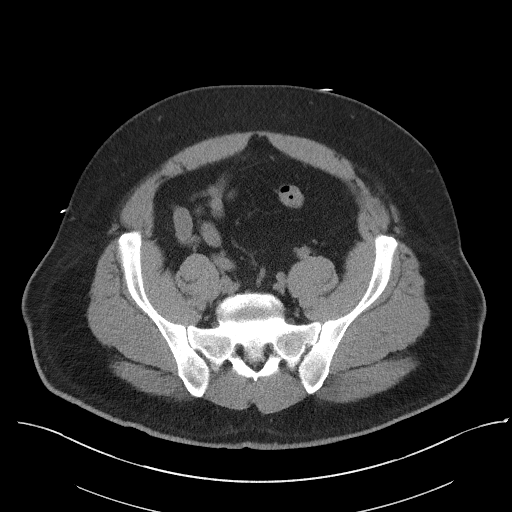
[im 42/100  soft-tissue]
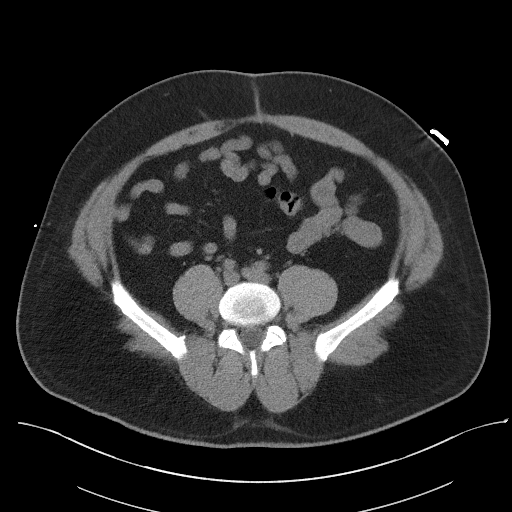
[im 50/100  soft-tissue]
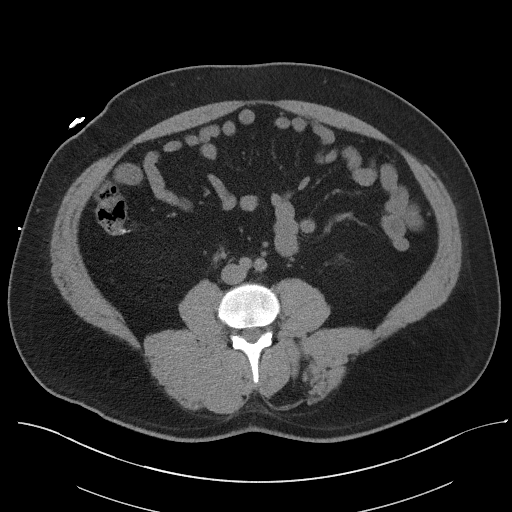
[im 58/100  soft-tissue]
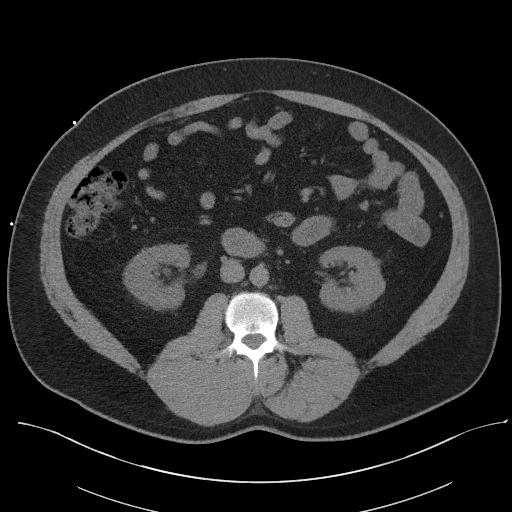
[im 67/100  soft-tissue]
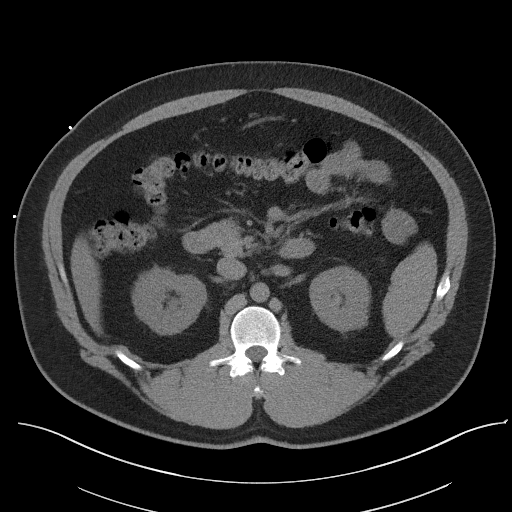
[im 67/100  bone]
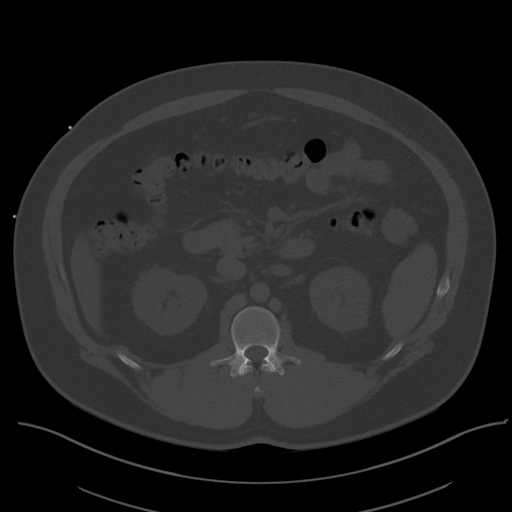
[im 71/100  soft-tissue]
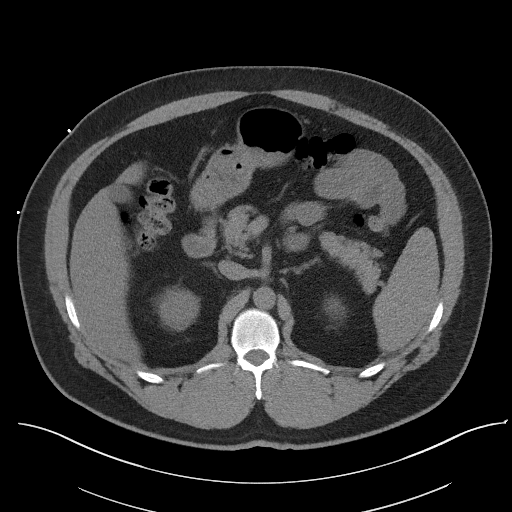
[im 79/100  soft-tissue]
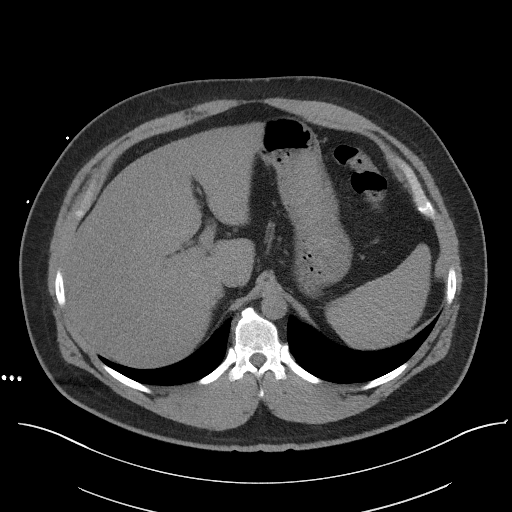
[im 87/100  soft-tissue]
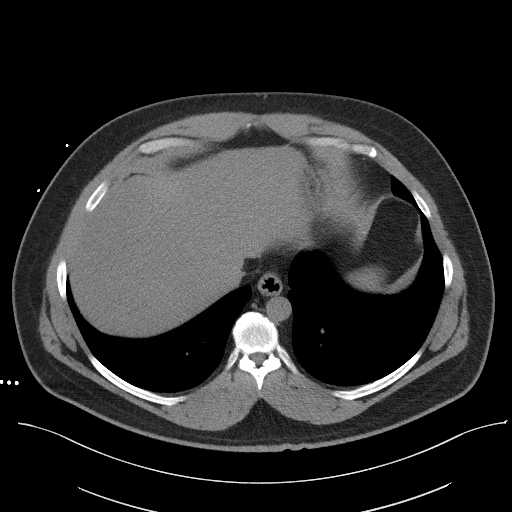
[im 95/100  soft-tissue]
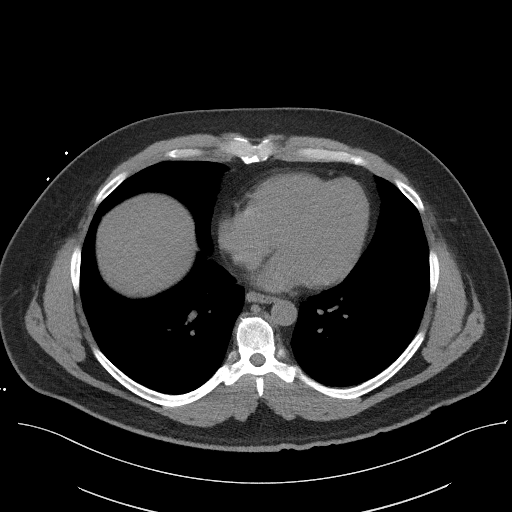

[Series 4: coronal st · coronal · 0.91mm/px · 3 of 106 slices shown]
[im 36/106  soft-tissue]
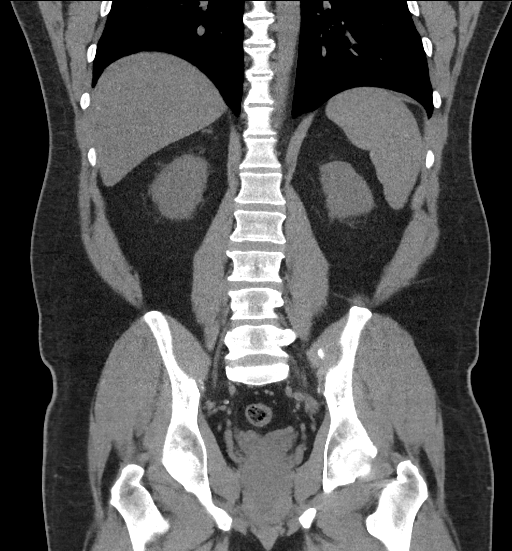
[im 47/106  soft-tissue]
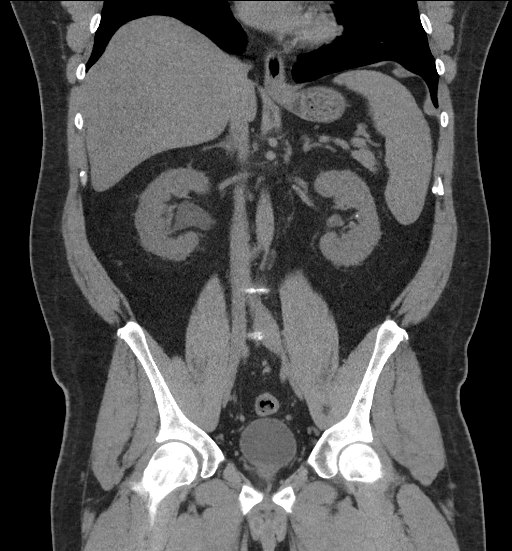
[im 59/106  soft-tissue]
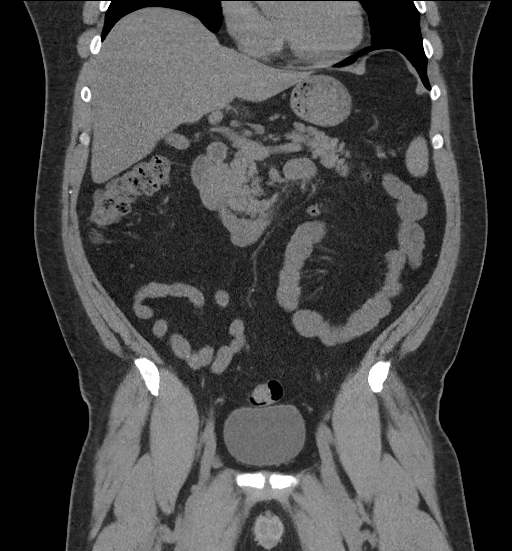

[16 of 46 positions shown; findings below may reference images not displayed]

FINDINGS: Lower chest: No acute abnormality.  Question trace hiatal hernia.

Hepatobiliary: No focal liver abnormality. No gallstones,
gallbladder wall thickening, or pericholecystic fluid. No biliary
dilatation.

Pancreas: No focal lesion. Normal pancreatic contour. No surrounding
inflammatory changes. No main pancreatic ductal dilatation.

Spleen: Normal in size without focal abnormality.

Adrenals/Urinary Tract:

No adrenal nodule bilaterally.

Punctate bilateral nephrolithiasis. There is a 2 mm calcified stone
within the proximal to mid right ureter with associated mild
proximal hydroureteronephrosis. No left hydronephrosis. No definite
contour-deforming renal mass.

No left ureterolithiasis or hydroureter.

The urinary bladder is unremarkable.

Stomach/Bowel: Stomach is within normal limits. No evidence of bowel
wall thickening or dilatation. Status post appendectomy.

Vascular/Lymphatic: No abdominal aorta or iliac aneurysm. Mild
atherosclerotic plaque of the aorta and its branches. No abdominal,
pelvic, or inguinal lymphadenopathy.

Reproductive: Prominent prostate.

Other: No intraperitoneal free fluid. No intraperitoneal free gas.
No organized fluid collection.

Musculoskeletal:

No abdominal wall hernia or abnormality.

No suspicious lytic or blastic osseous lesions. No acute displaced
fracture. Bilateral L5 pars interarticularis defects.
IMPRESSION: 1. Obstructive 2 mm proximal to mid right ureter nephrolithiasis.
2. Nonobstructive bilateral punctate nephrolithiasis.
3. Question trace hiatal hernia.
4. Prominent prostate.

## 2022-10-09 ENCOUNTER — Other Ambulatory Visit: Payer: Self-pay | Admitting: Cardiology

## 2022-11-06 ENCOUNTER — Other Ambulatory Visit: Payer: Self-pay | Admitting: Cardiology

## 2022-11-10 ENCOUNTER — Other Ambulatory Visit: Payer: Self-pay | Admitting: Cardiology

## 2022-11-17 ENCOUNTER — Other Ambulatory Visit: Payer: Self-pay | Admitting: Cardiology

## 2022-11-24 ENCOUNTER — Other Ambulatory Visit: Payer: Self-pay | Admitting: Cardiology

## 2022-12-23 ENCOUNTER — Other Ambulatory Visit: Payer: Self-pay | Admitting: Cardiology

## 2023-02-15 ENCOUNTER — Encounter: Payer: Self-pay | Admitting: Cardiology

## 2023-02-15 ENCOUNTER — Other Ambulatory Visit: Payer: Self-pay | Admitting: Cardiology

## 2023-03-27 IMAGING — DX DG CHEST 1V PORT
1 series · 1 of 1 positions shown · non-contrast
Comparison: None.

CLINICAL DATA: Shortness of breath.

EXAM:
PORTABLE CHEST 1 VIEW

[chest ap]
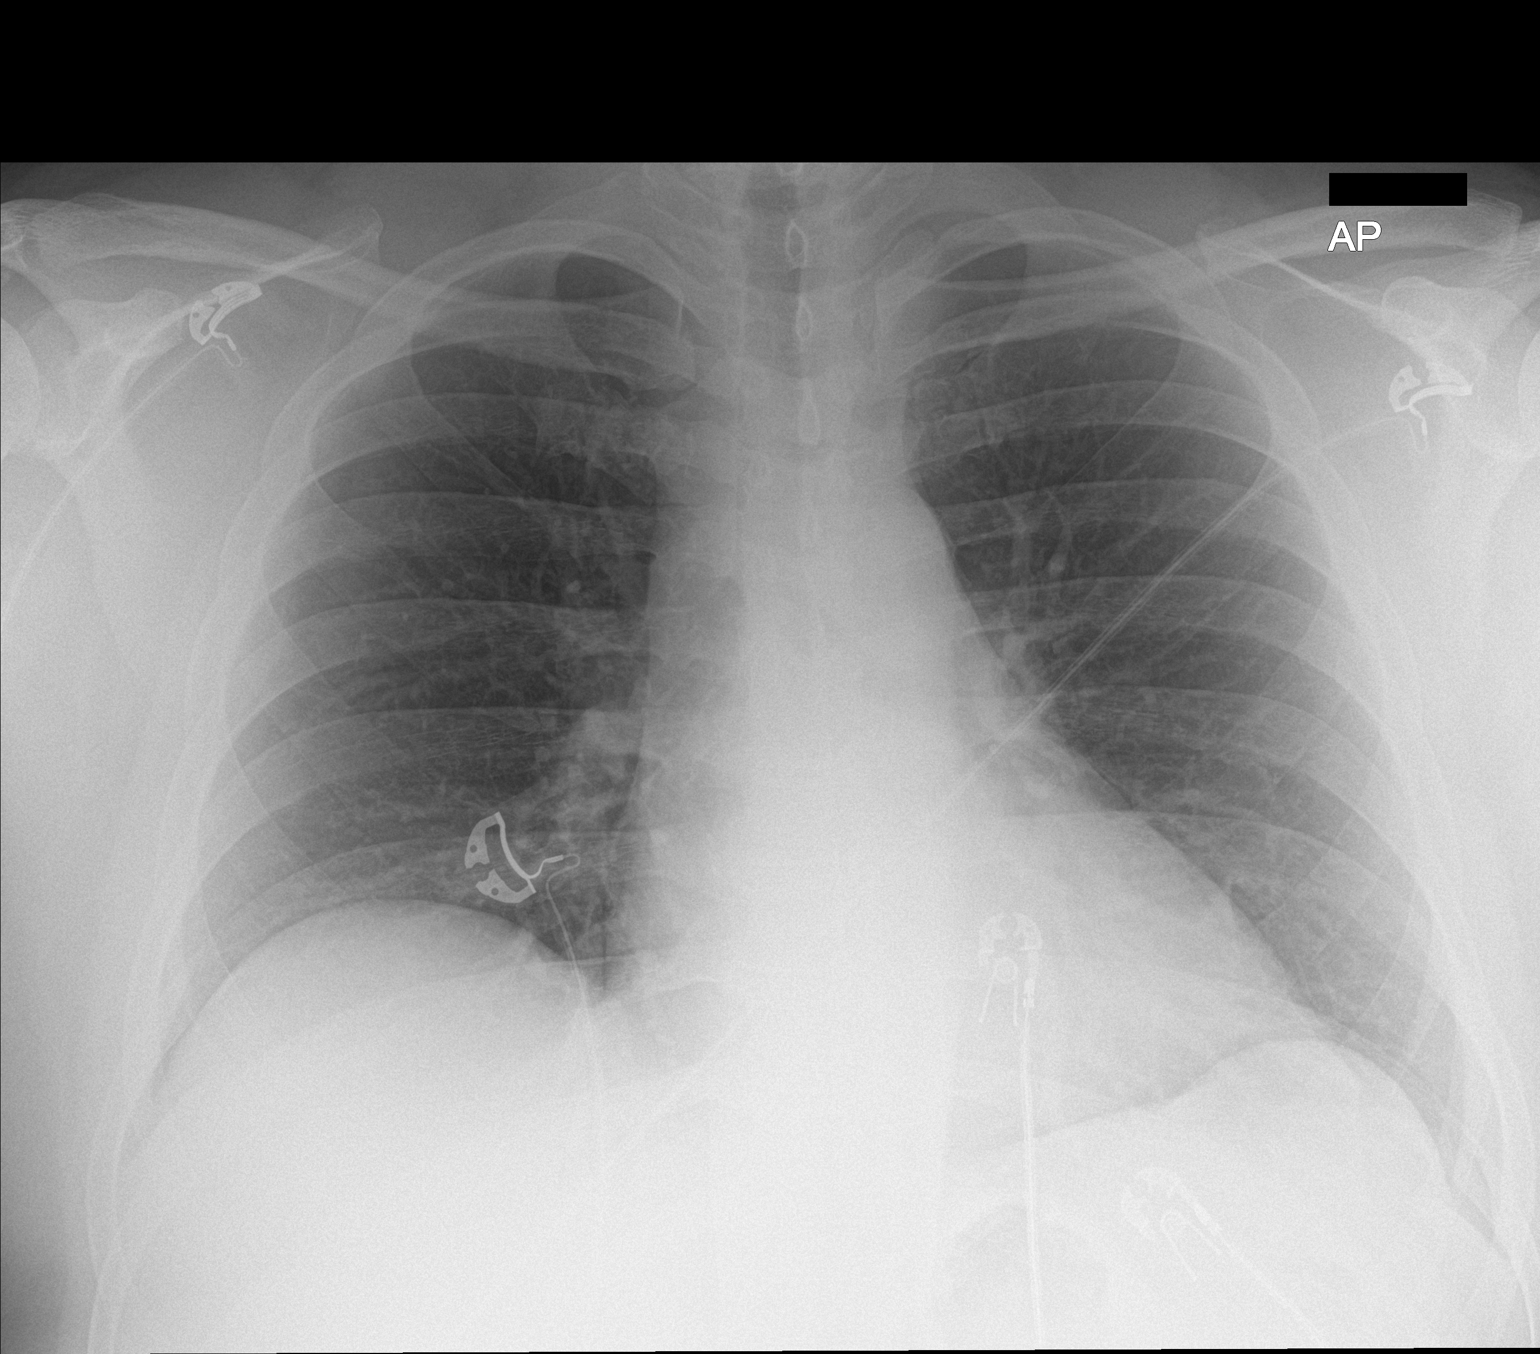

[1 of 1 positions shown; findings below may reference images not displayed]

FINDINGS: 5100 hours. Mild asymmetric elevation right hemidiaphragm. The lungs
are clear without focal pneumonia, edema, pneumothorax or pleural
effusion. Cardiopericardial silhouette is at upper limits of normal
for size. The visualized bony structures of the thorax are
unremarkable. Telemetry leads overlie the chest.
IMPRESSION: No active disease.

## 2023-04-10 IMAGING — CT CT HEART MORP W/ CTA COR W/ SCORE W/ CA W/CM &/OR W/O CM
4 of 7 series · 8 of 20 positions shown, 9 images · IV contrast (APPLIED)
Comparison: None.
COMPARISON: None.

Addendum:
EXAM:
OVER-READ INTERPRETATION  CT CHEST

The following report is an over-read performed by radiologist Dr.
Blade Aujla [REDACTED] on 10/11/2021. This
over-read does not include interpretation of cardiac or coronary
anatomy or pathology. The coronary calcium score/coronary CTA
interpretation by the cardiologist is attached.
CLINICAL DATA: 46 Year old White Male
Cardiac/Coronary  CTA
TECHNIQUE: The patient was scanned on a [employer] scanner.

[Series 6: ts diast sharp · axial · 0.39mm/px · z∈[-138,-101]mm · 2 of 278 slices shown]
[im 93/278  lung]
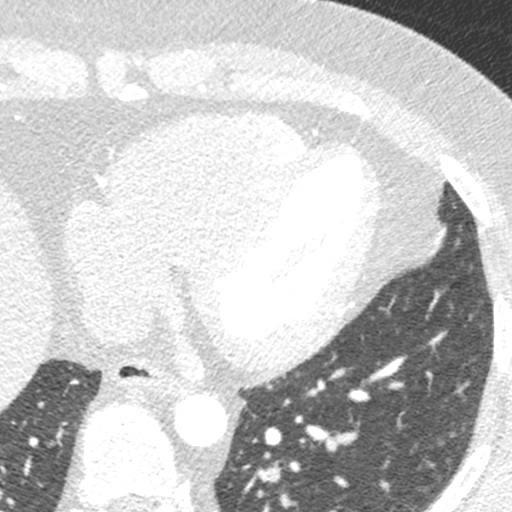
[im 185/278  lung]
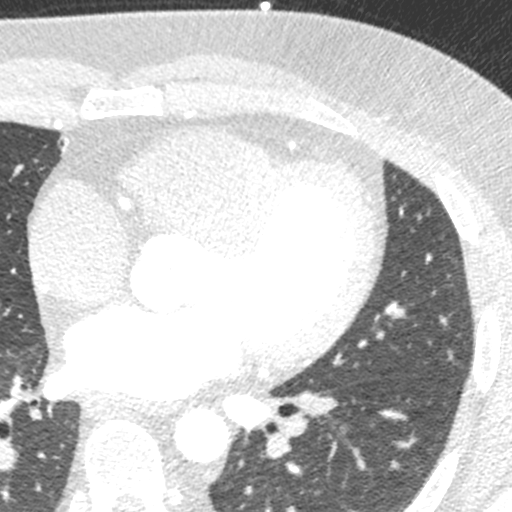

[Series 7: ts syst sharp · axial · 0.39mm/px · z∈[-138,-101]mm · 2 of 278 slices shown]
[im 93/278  lung]
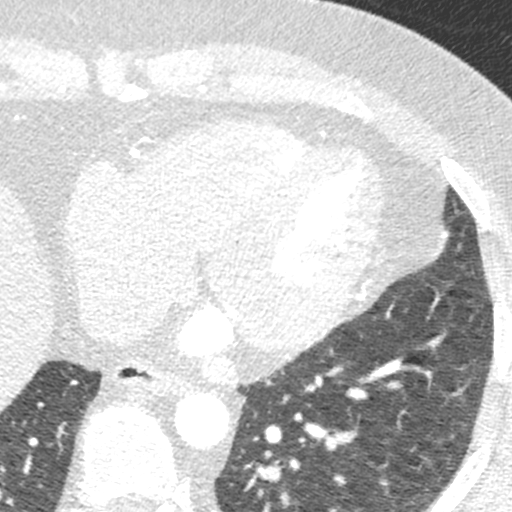
[im 185/278  lung]
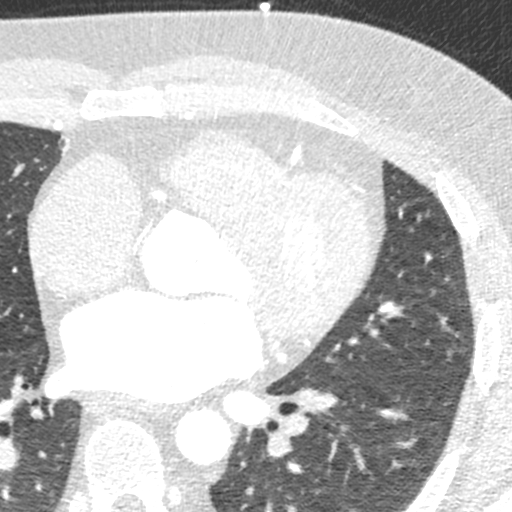

[Series 8: best syst · axial · 0.39mm/px · z∈[-138,-101]mm · 2 of 278 slices shown, 3 images]
[im 93/278  vessel]
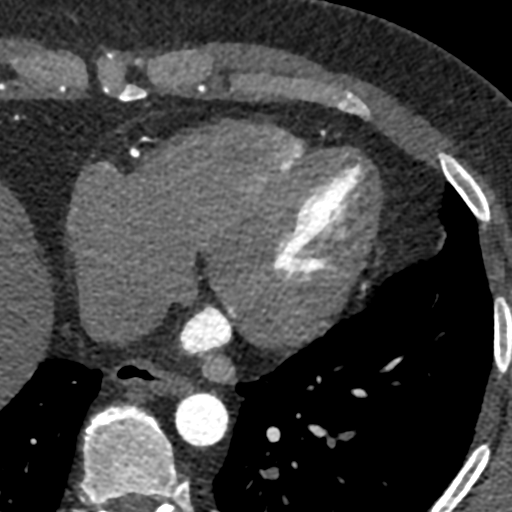
[im 93/278  lung]
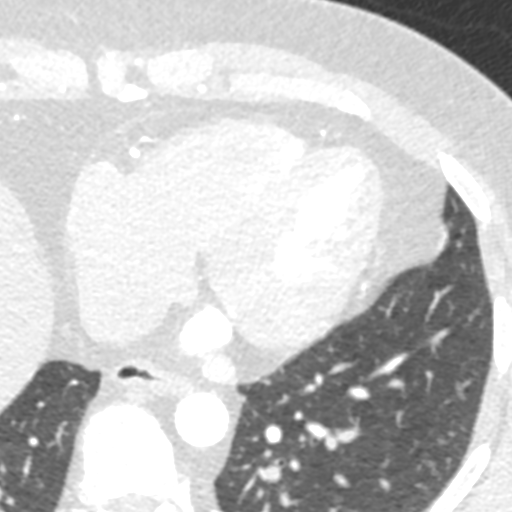
[im 185/278  vessel]
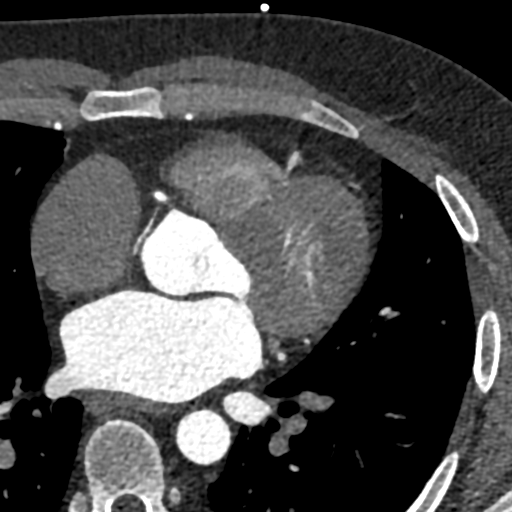

[Series 9: best diast · axial · 0.39mm/px · z∈[-138,-101]mm · 2 of 278 slices shown]
[im 93/278  vessel]
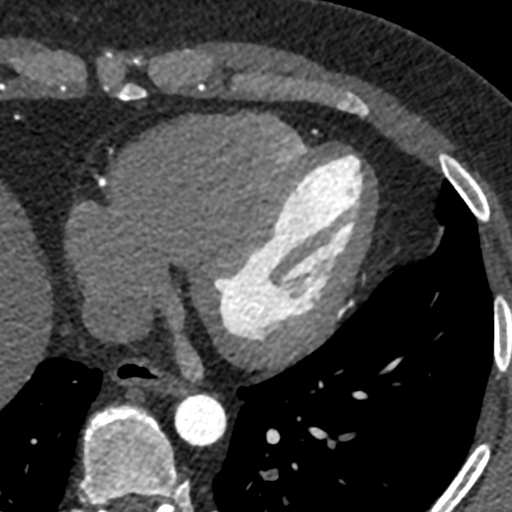
[im 185/278  vessel]
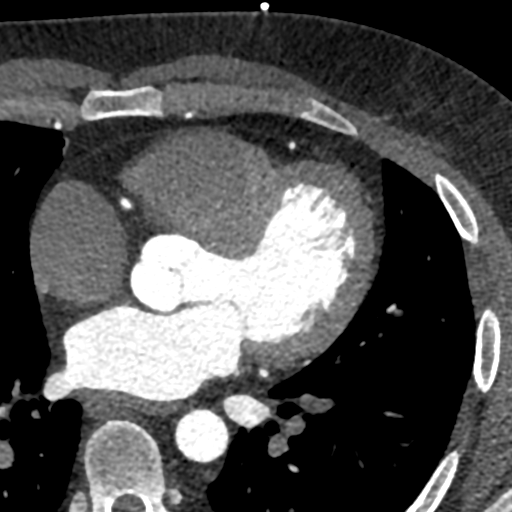

[8 of 20 positions shown; findings below may reference images not displayed]

FINDINGS: Within the visualized portions of the thorax there are no suspicious
appearing pulmonary nodules or masses, there is no acute
consolidative airspace disease, no pleural effusions, no
pneumothorax and no lymphadenopathy. Visualized portions of the
upper abdomen are unremarkable. There are no aggressive appearing
lytic or blastic lesions noted in the visualized portions of the
skeleton.
IMPRESSION: 1. No significant incidental noncardiac findings are noted.
FINDINGS: Scan was triggered in the descending thoracic aorta. Axial
non-contrast 3 mm slices were carried out through the heart. The
data set was analyzed on a dedicated work station and scored using
the Agatson method. Gantry rotation speed was 250 msecs and
collimation was .6 mm. 0.8 mg of sl NTG was given. The 3D data set
was reconstructed in 5% intervals of the 67-82 % of the R-R cycle.
Diastolic phases were analyzed on a dedicated work station using
MPR, MIP and VRT modes. The patient received 100 cc of contrast.

Aorta:  Normal size.  No calcifications.  No dissection.

Main Pulmonary Artery: Normal size of the pulmonary artery.

Aortic Valve:  Tri-leaflet.  No calcifications.

Coronary Arteries:  Normal coronary origin.  Right dominance.

Coronary Calcium Score:

Left main: 0

Left anterior descending artery: 0

Left circumflex artery: 0

Right coronary artery: 7

Total: 7

Percentile: 43rd for age, sex, and race matched control.

RCA is a large dominant artery that gives rise to PDA and PLA. Mild
luminal narrowing proximal RCA without discrete plaque. Mild mixed
plaque in the mid RCA.

Left main is a large artery that gives rise to LAD and LCX arteries.
There is no significant plaque.

LAD is a large vessel that gives rise to two diagonal vessels. There
is no significant plaque.

LCX is a non-dominant artery that gives rise to two OM vessels.
Minimal soft plaque mid LCX.

Other findings:

Normal pulmonary vein drainage into the left atrium.

Normal left atrial appendage without a thrombus.

Extra-cardiac findings: See attached radiology report for
non-cardiac structures.
IMPRESSION: 1. Coronary calcium score of 7. This was 43rd percentile for age,
sex, and race matched control.

2. Normal coronary origin with right dominance.

3. CAD-RADS 2. Mild non-obstructive CAD (25-49%). Consider
non-atherosclerotic causes of chest pain. Consider preventive
therapy and risk factor modification.

RECOMMENDATIONS:



If CAC = 0, it is reasonable to withhold statin therapy and reassess
in 5 to 10 years, as long as higher risk conditions are absent
(diabetes mellitus, family history of premature CHD in first degree
relatives (males <55 years; females <65 years), cigarette smoking,
LDL >=190 mg/dL or other independent risk factors).

If CAC is 1 to 99, it is reasonable to initiate statin therapy for
patients >=55 years of age.

If CAC is >=100 or >=75th percentile, it is reasonable to initiate
statin therapy at any age.

Cardiology referral should be considered for patients with CAC
scores =400 or >=75th percentile.

*0641 AHA/ACC/AACVPR/AAPA/ABC/IRANEIDE/NITTI/KOTOE/Fumu/TIERRA/MUNIYANDI/BQ
Guideline on the Management of Blood Cholesterol: A Report of the
American College of Cardiology/American Heart Association Task Force
on Clinical Practice Guidelines. J Am Coll Cardiol.
0084;73(24):6860-6914.

*** End of Addendum ***
EXAM:
OVER-READ INTERPRETATION  CT CHEST

The following report is an over-read performed by radiologist Dr.
Blade Aujla [REDACTED] on 10/11/2021. This
over-read does not include interpretation of cardiac or coronary
anatomy or pathology. The coronary calcium score/coronary CTA
interpretation by the cardiologist is attached.
FINDINGS: Within the visualized portions of the thorax there are no suspicious
appearing pulmonary nodules or masses, there is no acute
consolidative airspace disease, no pleural effusions, no
pneumothorax and no lymphadenopathy. Visualized portions of the
upper abdomen are unremarkable. There are no aggressive appearing
lytic or blastic lesions noted in the visualized portions of the
skeleton.
IMPRESSION: 1. No significant incidental noncardiac findings are noted.

## 2023-05-26 ENCOUNTER — Other Ambulatory Visit: Payer: Self-pay | Admitting: Cardiology

## 2023-06-26 ENCOUNTER — Encounter (HOSPITAL_BASED_OUTPATIENT_CLINIC_OR_DEPARTMENT_OTHER): Payer: Self-pay | Admitting: Emergency Medicine

## 2023-06-26 ENCOUNTER — Other Ambulatory Visit: Payer: Self-pay

## 2023-06-26 ENCOUNTER — Emergency Department (HOSPITAL_BASED_OUTPATIENT_CLINIC_OR_DEPARTMENT_OTHER)
Admission: EM | Admit: 2023-06-26 | Discharge: 2023-06-27 | Disposition: A | Discharge status: Home / Self Care | Payer: No Typology Code available for payment source | Attending: Emergency Medicine | Admitting: Emergency Medicine

## 2023-06-26 DIAGNOSIS — E119 Type 2 diabetes mellitus without complications: Secondary | ICD-10-CM | POA: Insufficient documentation

## 2023-06-26 DIAGNOSIS — N23 Unspecified renal colic: Secondary | ICD-10-CM | POA: Diagnosis not present

## 2023-06-26 DIAGNOSIS — Z7984 Long term (current) use of oral hypoglycemic drugs: Secondary | ICD-10-CM | POA: Insufficient documentation

## 2023-06-26 DIAGNOSIS — R000 Tachycardia, unspecified: Secondary | ICD-10-CM | POA: Diagnosis not present

## 2023-06-26 DIAGNOSIS — R1032 Left lower quadrant pain: Secondary | ICD-10-CM | POA: Diagnosis present

## 2023-06-26 NOTE — ED Notes (Signed)
ED Provider at bedside. 

## 2023-06-26 NOTE — ED Triage Notes (Signed)
Pt c/o LLQ with n/v/d radiating to LT lower back x 1 hr. Also reports indigestion x 12 hours

## 2023-06-27 ENCOUNTER — Emergency Department (HOSPITAL_BASED_OUTPATIENT_CLINIC_OR_DEPARTMENT_OTHER): Payer: No Typology Code available for payment source

## 2023-06-27 LAB — COMPREHENSIVE METABOLIC PANEL
ALT: 25 U/L (ref 0–44)
AST: 21 U/L (ref 15–41)
Albumin: 5.1 g/dL — ABNORMAL HIGH (ref 3.5–5.0)
Alkaline Phosphatase: 74 U/L (ref 38–126)
Anion gap: 12 (ref 5–15)
BUN: 13 mg/dL (ref 6–20)
CO2: 22 mmol/L (ref 22–32)
Calcium: 9.6 mg/dL (ref 8.9–10.3)
Chloride: 105 mmol/L (ref 98–111)
Creatinine, Ser: 1.61 mg/dL — ABNORMAL HIGH (ref 0.61–1.24)
GFR, Estimated: 54 mL/min — ABNORMAL LOW (ref >60)
Glucose, Bld: 133 mg/dL — ABNORMAL HIGH (ref 70–99)
Potassium: 3.3 mmol/L — ABNORMAL LOW (ref 3.5–5.1)
Sodium: 139 mmol/L (ref 135–145)
Total Bilirubin: 1.6 mg/dL — ABNORMAL HIGH (ref <1.2)
Total Protein: 8 g/dL (ref 6.5–8.1)

## 2023-06-27 LAB — CBC WITH DIFFERENTIAL/PLATELET
Abs Immature Granulocytes: 0.02 10*3/uL (ref 0.00–0.07)
Basophils Absolute: 0.1 10*3/uL (ref 0.0–0.1)
Basophils Relative: 1 %
Eosinophils Absolute: 0.5 10*3/uL (ref 0.0–0.5)
Eosinophils Relative: 5 %
HCT: 44 % (ref 39.0–52.0)
Hemoglobin: 15.6 g/dL (ref 13.0–17.0)
Immature Granulocytes: 0 %
Lymphocytes Relative: 30 %
Lymphs Abs: 2.8 10*3/uL (ref 0.7–4.0)
MCH: 29.5 pg (ref 26.0–34.0)
MCHC: 35.5 g/dL (ref 30.0–36.0)
MCV: 83.2 fL (ref 80.0–100.0)
Monocytes Absolute: 0.7 10*3/uL (ref 0.1–1.0)
Monocytes Relative: 8 %
Neutro Abs: 5.2 10*3/uL (ref 1.7–7.7)
Neutrophils Relative %: 56 %
Platelets: 265 10*3/uL (ref 150–400)
RBC: 5.29 MIL/uL (ref 4.22–5.81)
RDW: 13.4 % (ref 11.5–15.5)
WBC: 9.3 10*3/uL (ref 4.0–10.5)
nRBC: 0 % (ref 0.0–0.2)

## 2023-06-27 LAB — URINALYSIS, W/ REFLEX TO CULTURE (INFECTION SUSPECTED)
Bilirubin Urine: NEGATIVE
Glucose, UA: NEGATIVE mg/dL
Ketones, ur: 15 mg/dL — AB
Leukocytes,Ua: NEGATIVE
Nitrite: NEGATIVE
Protein, ur: NEGATIVE mg/dL
Specific Gravity, Urine: 1.01 (ref 1.005–1.030)
pH: 5 (ref 5.0–8.0)

## 2023-06-27 LAB — LIPASE, BLOOD: Lipase: 54 U/L — ABNORMAL HIGH (ref 11–51)

## 2023-06-27 LAB — TROPONIN I (HIGH SENSITIVITY): Troponin I (High Sensitivity): 3 ng/L (ref <18)

## 2023-06-27 LAB — CBG MONITORING, ED: Glucose-Capillary: 138 mg/dL — ABNORMAL HIGH (ref 70–99)

## 2023-06-27 MED ORDER — SODIUM CHLORIDE 0.9 % IV BOLUS
1000.0000 mL | Freq: Once | INTRAVENOUS | Status: AC
  Administered 2023-06-27: 1000 mL via INTRAVENOUS

## 2023-06-27 MED ORDER — ONDANSETRON 4 MG PO TBDP
4.0000 mg | ORAL_TABLET | Freq: Once | ORAL | Status: AC
  Administered 2023-06-27: 4 mg via ORAL
  Filled 2023-06-27: qty 1

## 2023-06-27 MED ORDER — IOHEXOL 300 MG/ML  SOLN
100.0000 mL | Freq: Once | INTRAMUSCULAR | Status: AC | PRN
  Administered 2023-06-27: 100 mL via INTRAVENOUS

## 2023-06-27 MED ORDER — HYDROMORPHONE HCL 1 MG/ML IJ SOLN
1.0000 mg | Freq: Once | INTRAMUSCULAR | Status: AC
  Administered 2023-06-27: 1 mg via INTRAVENOUS
  Filled 2023-06-27: qty 1

## 2023-06-27 MED ORDER — OXYCODONE-ACETAMINOPHEN 5-325 MG PO TABS: 1.0000 | ORAL_TABLET | Freq: Four times a day (QID) | ORAL | 0 refills | Status: AC | PRN

## 2023-06-27 MED ORDER — ONDANSETRON 4 MG PO TBDP
4.0000 mg | ORAL_TABLET | Freq: Three times a day (TID) | ORAL | 0 refills | Status: AC | PRN
Start: 2023-06-27 — End: ?

## 2023-06-27 MED ORDER — ONDANSETRON HCL 4 MG/2ML IJ SOLN
4.0000 mg | Freq: Once | INTRAMUSCULAR | Status: AC
  Administered 2023-06-27: 4 mg via INTRAVENOUS
  Filled 2023-06-27: qty 2

## 2023-06-27 MED ORDER — OXYCODONE-ACETAMINOPHEN 5-325 MG PO TABS
1.0000 | ORAL_TABLET | Freq: Once | ORAL | Status: AC
Start: 2023-06-27 — End: 2023-06-27
  Administered 2023-06-27: 1 via ORAL
  Filled 2023-06-27: qty 1

## 2023-06-27 MED ORDER — KETAMINE HCL 10 MG/ML IJ SOLN
0.3000 mg/kg | Freq: Once | INTRAMUSCULAR | Status: DC
Start: 2023-06-27 — End: 2023-06-27

## 2023-06-27 MED ORDER — KETOROLAC TROMETHAMINE 10 MG PO TABS: 10.0000 mg | ORAL_TABLET | Freq: Three times a day (TID) | ORAL | 0 refills | Status: AC | PRN

## 2023-06-27 MED ORDER — TAMSULOSIN HCL 0.4 MG PO CAPS: 0.4000 mg | ORAL_CAPSULE | Freq: Every day | ORAL | 0 refills | Status: AC

## 2023-06-27 MED ORDER — KETOROLAC TROMETHAMINE 15 MG/ML IJ SOLN
15.0000 mg | Freq: Once | INTRAMUSCULAR | Status: AC
  Administered 2023-06-27: 15 mg via INTRAVENOUS
  Filled 2023-06-27: qty 1

## 2023-06-27 NOTE — ED Notes (Addendum)
Gave 3 doses of dilaudid - pain medication lasting 10 -15 mins.  Patient assessed between each does. BP elevated and unable to stay still. Pain 8-10/10. EDP assess prior to third dose.

## 2023-06-27 NOTE — ED Provider Notes (Signed)
[place] [facility] AT [facility] Provider Note   CSN: [id number] Arrival date & time: 06/26/23  2346     History  Chief Complaint  Patient presents with   Abdominal Pain    Ricardo Ferguson is a 47 y.o. male.  The history is provided by the patient, a significant other and medical records.  Abdominal Pain Ricardo Ferguson is a 47 y.o. male who presents to the Emergency Department complaining of abdominal pain.  He presents to the emergency department for evaluation of left lower quadrant abdominal pain that started about 1 hour prior to ED arrival.  Pain is constant and radiates to the left low back.  He has also been experiencing nausea, vomiting and diarrhea for 24 hours.  No hematochezia, melena, hematemesis.  No testicular pain.  No prior similar symptoms.  He did recently get diagnosed with diabetes and started on metformin and Mounjaro about 3 weeks ago.  He has been tolerating these medications well.     Home Medications Prior to Admission medications   Medication Sig Start Date End Date Taking? Authorizing Provider  ketorolac (TORADOL) 10 MG tablet Take 1 tablet (10 mg total) by mouth every 8 (eight) hours as needed. 06/27/23  Yes Tilden Fossa, MD  metFORMIN (GLUCOPHAGE-XR) 500 MG 24 hr tablet Take by mouth. 06/05/23  Yes [provider]  ondansetron (ZOFRAN-ODT) 4 MG disintegrating tablet Take 1 tablet (4 mg total) by mouth every 8 (eight) hours as needed. 06/27/23  Yes Tilden Fossa, MD  oxyCODONE-acetaminophen (PERCOCET/ROXICET) 5-325 MG tablet Take 1 tablet by mouth every 6 (six) hours as needed for severe pain (pain score 7-10). 06/27/23  Yes Tilden Fossa, MD  tamsulosin (FLOMAX) 0.4 MG CAPS capsule Take 1 capsule (0.4 mg total) by mouth daily. 06/27/23  Yes Tilden Fossa, MD  amLODipine (NORVASC) 2.5 MG tablet Take 1 tablet (2.5 mg total) by mouth daily. Please call [phone] to schedule an overdue appointment for future refills.  Final attempt. Thank you. 11/24/22   Jake Bathe, MD  metoprolol tartrate (LOPRESSOR) 100 MG tablet Take one tablet by mouth 2 hours prior to CT scan 09/28/21   Jake Bathe, MD  MOUNJARO 2.5 MG/0.5ML Pen SMARTSIG:2.5 Milligram(s) SUB-Q Once a Week    [provider]  nitroGLYCERIN (NITROSTAT) 0.4 MG SL tablet Place 1 tablet (0.4 mg total) under the tongue every 5 (five) minutes as needed for chest pain. 09/27/21   Rolan Bucco, MD  rosuvastatin (CRESTOR) 10 MG tablet Take 1 tablet (10 mg total) by mouth daily. 11/17/22   Jake Bathe, MD      Allergies    Patient has no known allergies.    Review of Systems   Review of Systems  Gastrointestinal:  Positive for abdominal pain.  All other systems reviewed and are negative.   Physical Exam Updated Vital Signs BP 137/82 (BP Location: Right Arm)   Pulse (!) 102   Temp 98.3 F (36.8 C) (Oral)   Resp 16   Ht 5\' 7"  (1.702 m)   Wt 104.3 kg   SpO2 97%   BMI 36.02 kg/m  Physical Exam Vitals and nursing note reviewed.  Constitutional:      General: He is in acute distress.     Appearance: He is well-developed.  HENT:     Head: Normocephalic and atraumatic.  Cardiovascular:     Rate and Rhythm: Regular rhythm. Tachycardia present.     Heart sounds: No murmur heard. Pulmonary:  Effort: Pulmonary effort is normal. No respiratory distress.     Breath sounds: Normal breath sounds.  Abdominal:     Palpations: Abdomen is soft.     Tenderness: There is no guarding or rebound.     Comments: Moderate left lower quadrant tenderness  Musculoskeletal:        General: No tenderness.  Skin:    General: Skin is warm and dry.  Neurological:     Mental Status: He is alert and oriented to person, place, and time.  Psychiatric:        Behavior: Behavior normal.     ED Results / Procedures / Treatments   Labs (all labs ordered are listed, but only abnormal results are displayed) Labs Reviewed  COMPREHENSIVE METABOLIC PANEL  - Abnormal; Notable for the following components:      Result Value   Potassium 3.3 (*)    Glucose, Bld 133 (*)    Creatinine, Ser 1.61 (*)    Albumin 5.1 (*)    Total Bilirubin 1.6 (*)    GFR, Estimated 54 (*)    All other components within normal limits  LIPASE, BLOOD - Abnormal; Notable for the following components:   Lipase 54 (*)    All other components within normal limits  URINALYSIS, W/ REFLEX TO CULTURE (INFECTION SUSPECTED) - Abnormal; Notable for the following components:   Hgb urine dipstick LARGE (*)    Ketones, ur 15 (*)    Bacteria, UA RARE (*)    All other components within normal limits  CBG MONITORING, ED - Abnormal; Notable for the following components:   Glucose-Capillary 138 (*)    All other components within normal limits  CBC WITH DIFFERENTIAL/PLATELET  TROPONIN I (HIGH SENSITIVITY)  TROPONIN I (HIGH SENSITIVITY)    EKG None  Radiology CT ABDOMEN PELVIS W CONTRAST Result Date: 06/27/2023 CLINICAL DATA:  Left lower quadrant pain. EXAM: CT ABDOMEN AND PELVIS WITH CONTRAST TECHNIQUE: Multidetector CT imaging of the abdomen and pelvis was performed using the standard protocol following bolus administration of intravenous contrast. RADIATION DOSE REDUCTION: This exam was performed according to the departmental dose-optimization program which includes automated exposure control, adjustment of the mA and/or kV according to patient size and/or use of iterative reconstruction technique. CONTRAST:  OMNIPAQUE IOHEXOL 300 MG/ML  SOLN COMPARISON:  April 09, 2021 FINDINGS: Lower chest: No acute abnormality. Hepatobiliary: No focal liver abnormality is seen. No gallstones, gallbladder wall thickening, or biliary dilatation. Pancreas: Unremarkable. No pancreatic ductal dilatation or surrounding inflammatory changes. Spleen: Normal in size without focal abnormality. Adrenals/Urinary Tract: Adrenal glands are unremarkable. Kidneys are normal in size. A 2.3 cm diameter  simple cyst is seen within the anterior aspect of the mid right kidney. A 3 mm obstructing renal calculus is seen within the proximal to mid left ureter, with mild left-sided hydronephrosis and hydroureter. A punctate nonobstructing renal calculus is seen within the lower pole of the left kidney. Bladder is unremarkable. Stomach/Bowel: Stomach is within normal limits. The appendix is surgically absent. No evidence of bowel wall thickening, distention, or inflammatory changes. Vascular/Lymphatic: No significant vascular findings are present. No enlarged abdominal or pelvic lymph nodes. Reproductive: The prostate gland is mildly enlarged. Other: No abdominal wall hernia or abnormality. No abdominopelvic ascites. Musculoskeletal: No acute or significant osseous findings. IMPRESSION: 1. 3 mm obstructing renal calculus within the proximal to mid left ureter. 2. Punctate nonobstructing left renal calculus. 3. 2.3 cm diameter simple right renal cyst. No follow-up imaging is recommended. This recommendation  follows ACR consensus guidelines: Management of the Incidental Renal Mass on CT: A White Paper of the ACR Incidental Findings Committee. J Am Coll Radiol [phone]. Electronically Signed   By: Aram Candela M.D.   On: 06/27/2023 01:29    Procedures Procedures    Medications Ordered in ED Medications  ketamine (KETALAR) injection 31 mg (31 mg Intravenous Not Given 06/27/23 0517)  HYDROmorphone (DILAUDID) injection 1 mg (1 mg Intravenous Given 06/27/23 0011)  ondansetron (ZOFRAN) injection 4 mg (4 mg Intravenous Given 06/27/23 0011)  sodium chloride 0.9 % bolus 1,000 mL (0 mLs Intravenous Stopped 06/27/23 0231)  HYDROmorphone (DILAUDID) injection 1 mg (1 mg Intravenous Given 06/27/23 0040)  HYDROmorphone (DILAUDID) injection 1 mg (1 mg Intravenous Given 06/27/23 0106)  iohexol (OMNIPAQUE) 300 MG/ML solution 100 mL (100 mLs Intravenous Contrast Given 06/27/23 0107)  ketorolac (TORADOL) 15 MG/ML  injection 15 mg (15 mg Intravenous Given 06/27/23 0120)  oxyCODONE-acetaminophen (PERCOCET/ROXICET) 5-325 MG per tablet 1 tablet (1 tablet Oral Given 06/27/23 0518)  ondansetron (ZOFRAN-ODT) disintegrating tablet 4 mg (4 mg Oral Given 06/27/23 8119)    ED Course/ Medical Decision Making/ A&P                                 Medical Decision Making Amount and/or Complexity of Data Reviewed Labs: ordered. Radiology: ordered.  Risk Prescription drug management.   Patient here for evaluation of left lower quadrant abdominal pain, radiates to the back as well as vomiting and diarrhea for the last 24 hours.  Patient in distress at time of ED arrival, writhing on the stretcher and cannot get comfortable.  He required multiple rounds of pain medications for pain control.  BMP with mild elevation in his creatinine.  CBC is within normal limits.  CT abdomen pelvis was obtained, which demonstrates a left ureteral stone.  Patient was treated with ketorolac with significant improvement in his pain.  He was observed for several hours without recurrent pain.  UA is not consistent with UTI.  Discussed with patient home care for kidney stone.  Discussed outpatient urology follow-up as well as close return precautions for progressive or concerning symptoms.        Final Clinical Impression(s) / ED Diagnoses Final diagnoses:  Ureteral colic    Rx / DC Orders ED Discharge Orders          Ordered    tamsulosin (FLOMAX) 0.4 MG CAPS capsule  Daily        06/27/23 0451    ondansetron (ZOFRAN-ODT) 4 MG disintegrating tablet  Every 8 hours PRN        06/27/23 0451    ketorolac (TORADOL) 10 MG tablet  Every 8 hours PRN        06/27/23 0451    oxyCODONE-acetaminophen (PERCOCET/ROXICET) 5-325 MG tablet  Every 6 hours PRN        06/27/23 0454              Tilden Fossa, MD 06/27/23 [phone]

## 2023-06-27 NOTE — Discharge Instructions (Addendum)
You have a 3 mm stone in your left ureter.  Your kidney function was slightly worse today than before.  Drink plenty of fluids.  Get rechecked if you cannot pee, have uncontrolled pain or develop fevers.  Please call for follow-up with the urologist or your family doctor.

## 2023-06-27 NOTE — ED Notes (Signed)
EDP stated 2nd trop was not needed
